# Patient Record
Sex: Female | Born: 1935 | Race: White | Hispanic: No | Smoking: Current some day smoker
Health system: Southern US, Community
[De-identification: ages and names within clinical notes are randomized; demographics above are authoritative.]

## PROBLEM LIST (undated history)

## (undated) DIAGNOSIS — I10 Essential (primary) hypertension: Secondary | ICD-10-CM

## (undated) DIAGNOSIS — E785 Hyperlipidemia, unspecified: Secondary | ICD-10-CM

## (undated) DIAGNOSIS — Z87448 Personal history of other diseases of urinary system: Secondary | ICD-10-CM

## (undated) DIAGNOSIS — Z8669 Personal history of other diseases of the nervous system and sense organs: Secondary | ICD-10-CM

## (undated) DIAGNOSIS — Z8659 Personal history of other mental and behavioral disorders: Secondary | ICD-10-CM

## (undated) DIAGNOSIS — I4891 Unspecified atrial fibrillation: Secondary | ICD-10-CM

## (undated) DIAGNOSIS — C50919 Malignant neoplasm of unspecified site of unspecified female breast: Secondary | ICD-10-CM

## (undated) DIAGNOSIS — K5790 Diverticulosis of intestine, part unspecified, without perforation or abscess without bleeding: Secondary | ICD-10-CM

## (undated) DIAGNOSIS — I341 Nonrheumatic mitral (valve) prolapse: Secondary | ICD-10-CM

## (undated) DIAGNOSIS — K219 Gastro-esophageal reflux disease without esophagitis: Secondary | ICD-10-CM

## (undated) DIAGNOSIS — H269 Unspecified cataract: Secondary | ICD-10-CM

## (undated) DIAGNOSIS — E039 Hypothyroidism, unspecified: Secondary | ICD-10-CM

## (undated) HISTORY — PX: SHOULDER SURGERY: SHX246

## (undated) HISTORY — DX: Diverticulosis of intestine, part unspecified, without perforation or abscess without bleeding: K57.90

## (undated) HISTORY — DX: Malignant neoplasm of unspecified site of unspecified female breast: C50.919

## (undated) HISTORY — PX: OTHER SURGICAL HISTORY: SHX169

## (undated) HISTORY — DX: Personal history of other diseases of urinary system: Z87.448

## (undated) HISTORY — DX: Hyperlipidemia, unspecified: E78.5

## (undated) HISTORY — DX: Hypothyroidism, unspecified: E03.9

## (undated) HISTORY — DX: Unspecified atrial fibrillation: I48.91

## (undated) HISTORY — DX: Essential (primary) hypertension: I10

## (undated) HISTORY — DX: Personal history of other mental and behavioral disorders: Z86.59

## (undated) HISTORY — DX: Personal history of other diseases of the nervous system and sense organs: Z86.69

## (undated) HISTORY — DX: Unspecified cataract: H26.9

## (undated) HISTORY — DX: Gastro-esophageal reflux disease without esophagitis: K21.9

## (undated) HISTORY — DX: Nonrheumatic mitral (valve) prolapse: I34.1

---

## 1995-09-07 HISTORY — PX: LEFT HEART CATH: SHX5946

## 1995-09-07 HISTORY — PX: INGUINAL HERNIA REPAIR: SUR1180

## 1997-12-05 ENCOUNTER — Other Ambulatory Visit: Admission: RE | Admit: 1997-12-05 | Discharge: 1997-12-05 | Payer: Self-pay | Admitting: Obstetrics and Gynecology

## 1998-12-08 ENCOUNTER — Other Ambulatory Visit: Admission: RE | Admit: 1998-12-08 | Discharge: 1998-12-08 | Payer: Self-pay | Admitting: Obstetrics and Gynecology

## 1999-12-14 ENCOUNTER — Other Ambulatory Visit: Admission: RE | Admit: 1999-12-14 | Discharge: 1999-12-14 | Payer: Self-pay | Admitting: Obstetrics and Gynecology

## 2000-11-15 ENCOUNTER — Other Ambulatory Visit: Admission: RE | Admit: 2000-11-15 | Discharge: 2000-11-15 | Payer: Self-pay | Admitting: Obstetrics and Gynecology

## 2007-11-22 ENCOUNTER — Encounter: Admission: RE | Admit: 2007-11-22 | Discharge: 2007-11-22 | Payer: Self-pay | Admitting: Internal Medicine

## 2007-11-30 ENCOUNTER — Encounter: Admission: RE | Admit: 2007-11-30 | Discharge: 2007-11-30 | Payer: Self-pay | Admitting: General Surgery

## 2007-12-11 ENCOUNTER — Encounter: Admission: RE | Admit: 2007-12-11 | Discharge: 2007-12-11 | Payer: Self-pay | Admitting: *Deleted

## 2007-12-14 ENCOUNTER — Ambulatory Visit (HOSPITAL_COMMUNITY): Admission: RE | Admit: 2007-12-14 | Discharge: 2007-12-14 | Payer: Self-pay | Admitting: *Deleted

## 2008-01-09 ENCOUNTER — Ambulatory Visit: Admission: RE | Admit: 2008-01-09 | Discharge: 2008-01-09 | Payer: Self-pay | Admitting: Gynecologic Oncology

## 2008-01-10 ENCOUNTER — Ambulatory Visit (HOSPITAL_COMMUNITY): Admission: RE | Admit: 2008-01-10 | Discharge: 2008-01-10 | Payer: Self-pay | Admitting: Gynecologic Oncology

## 2008-01-11 ENCOUNTER — Ambulatory Visit (HOSPITAL_COMMUNITY): Admission: RE | Admit: 2008-01-11 | Discharge: 2008-01-11 | Payer: Self-pay | Admitting: Gynecologic Oncology

## 2008-01-12 ENCOUNTER — Ambulatory Visit (HOSPITAL_COMMUNITY): Admission: RE | Admit: 2008-01-12 | Discharge: 2008-01-12 | Payer: Self-pay | Admitting: Gynecologic Oncology

## 2008-02-27 ENCOUNTER — Ambulatory Visit (HOSPITAL_COMMUNITY): Admission: RE | Admit: 2008-02-27 | Discharge: 2008-02-28 | Payer: Self-pay | Admitting: General Surgery

## 2008-07-15 IMAGING — US US TRANSVAGINAL NON-OB
1 series · 13 of 25 positions shown · non-contrast
Comparison: CT pelvis 11/30/2007.

CLINICAL DATA: Cystic and solid "mass" in the right adnexa on
previous CT examination.

TRANSABDOMINAL AND TRANSVAGINAL ULTRASOUND OF PELVIS 01/10/2008:
TECHNIQUE: Both transabdominal and transvaginal ultrasound
examinations of the pelvis were performed including evaluation of
the uterus, ovaries, adnexal regions, and pelvic cul-de-sac.
Because of incomplete bladder filling on the initial images, repeat
transabdominal imaging was performed approximately 2 hours after
the initial images to be sure that the right adnexa was completely
imaged.

[Series 1: unknown · 0.22mm/px · 13 of 100 slices shown]
[im 1/100]
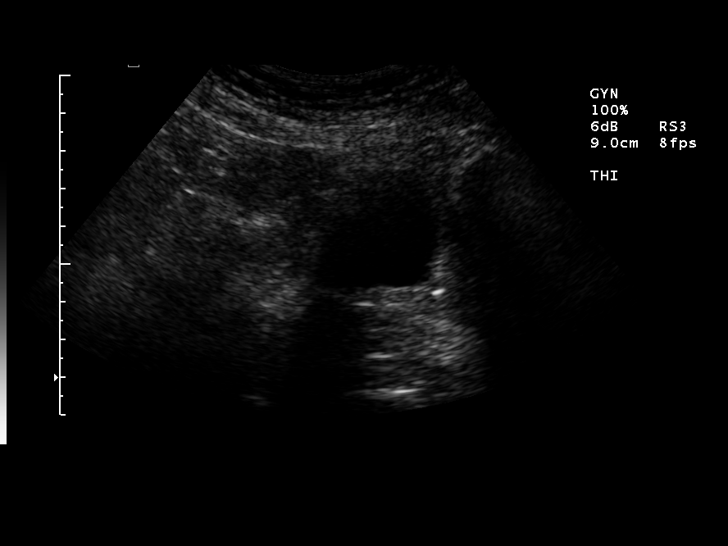
[im 9/100]
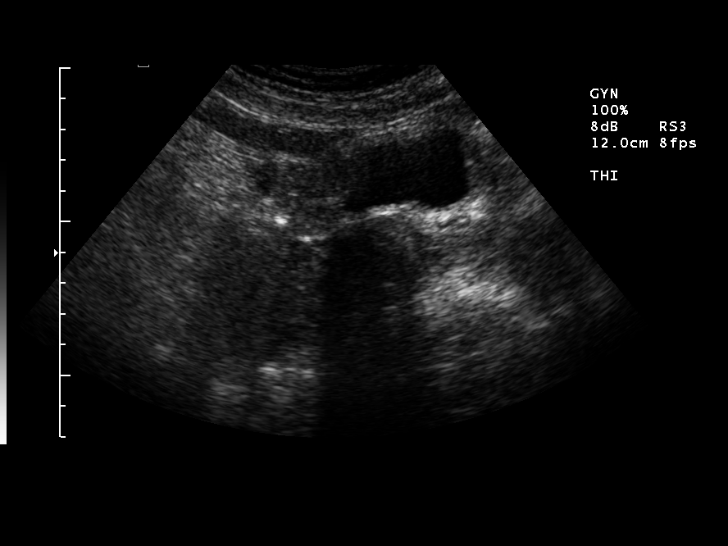
[im 17/100]
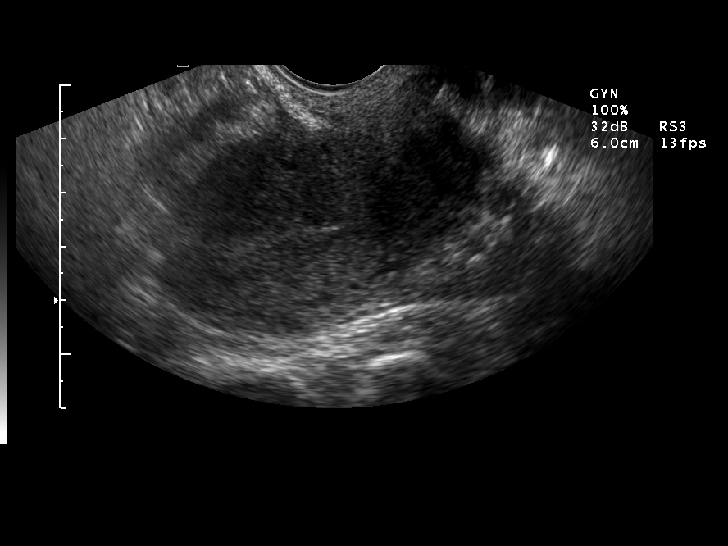
[im 25/100]
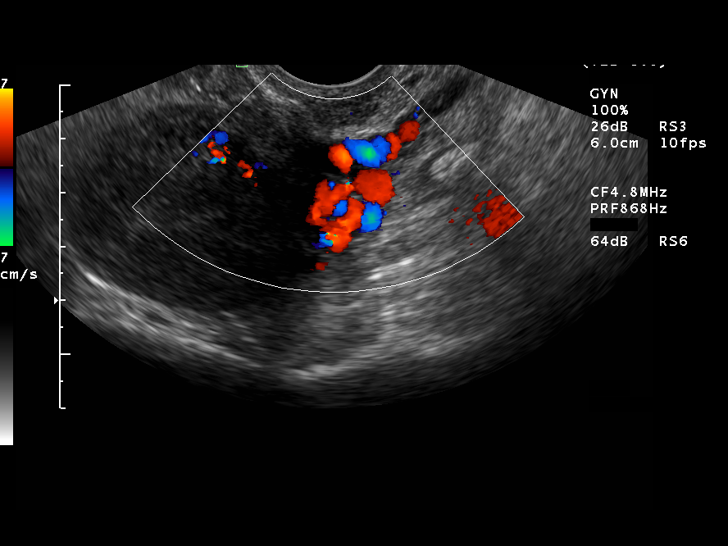
[im 34/100]
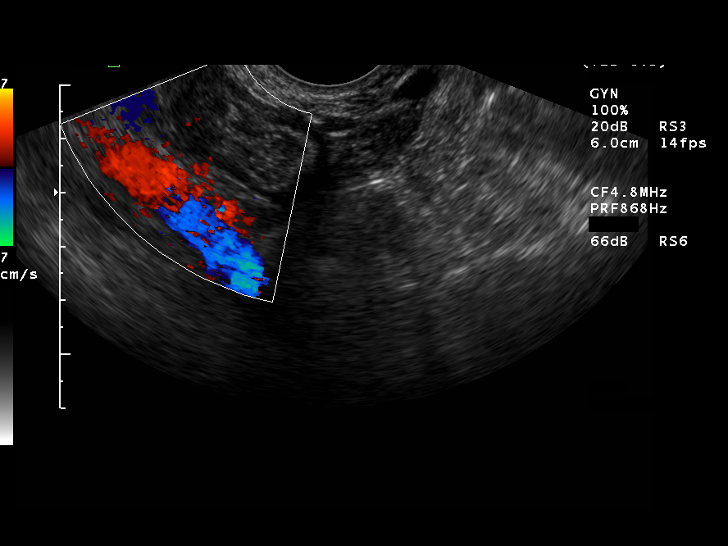
[im 42/100]
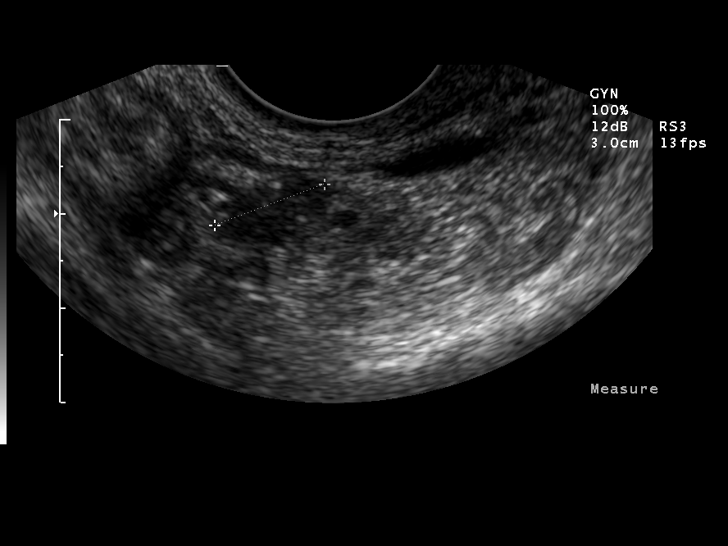
[im 50/100]
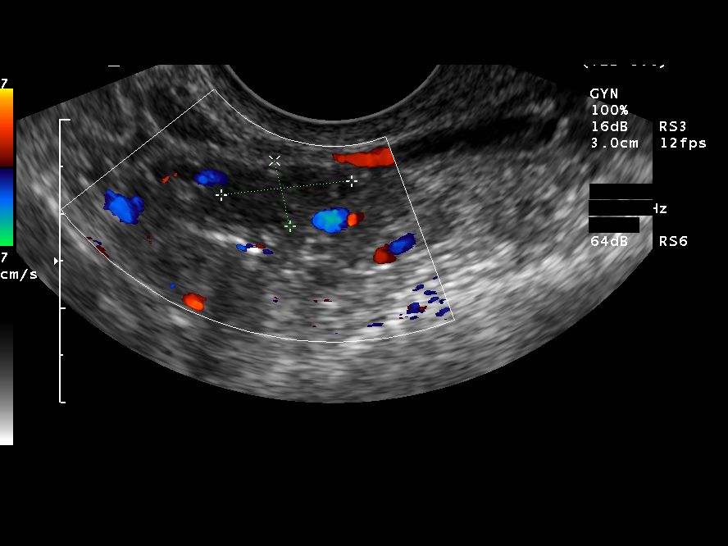
[im 58/100]
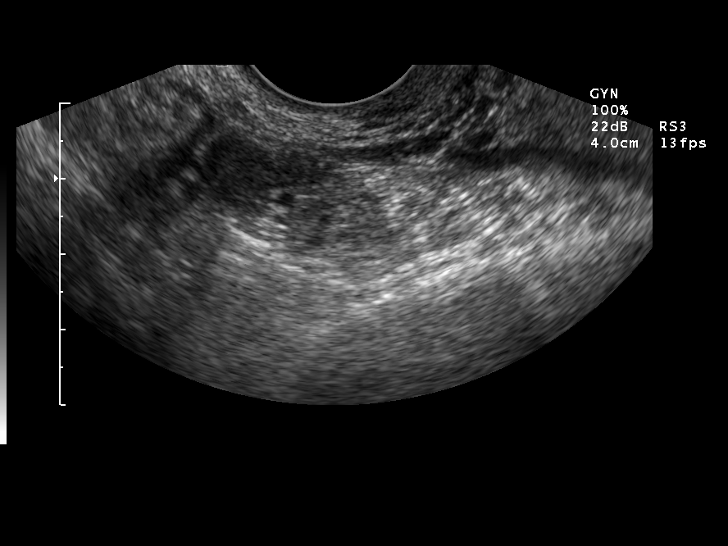
[im 67/100]
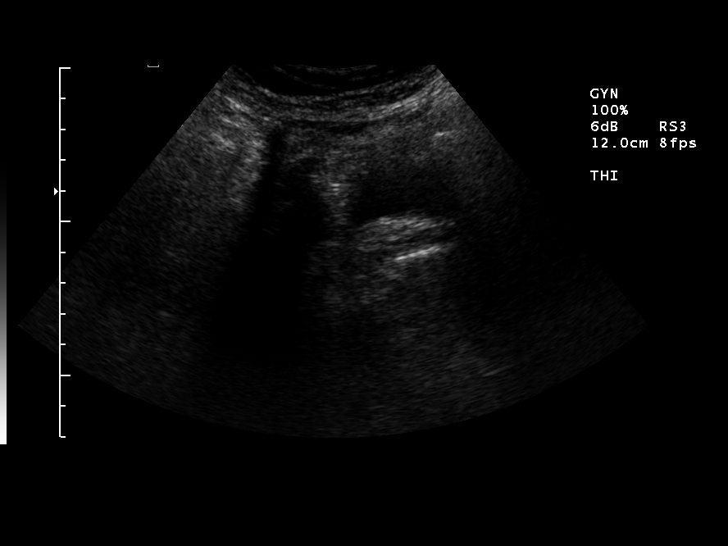
[im 75/100]
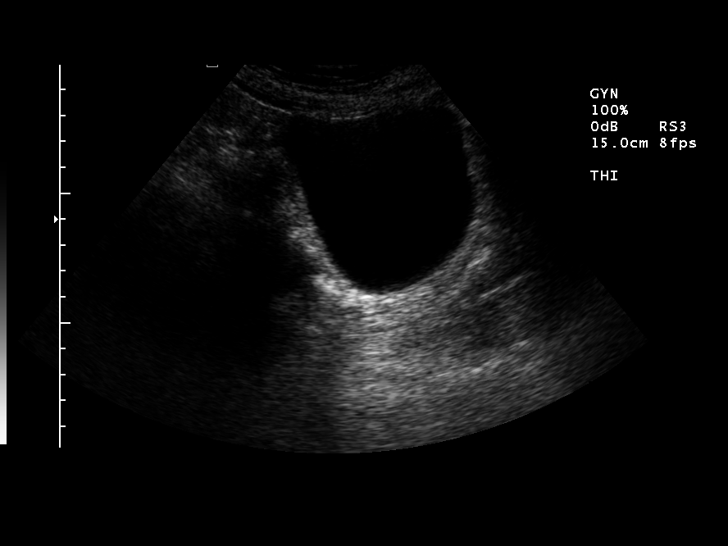
[im 83/100]
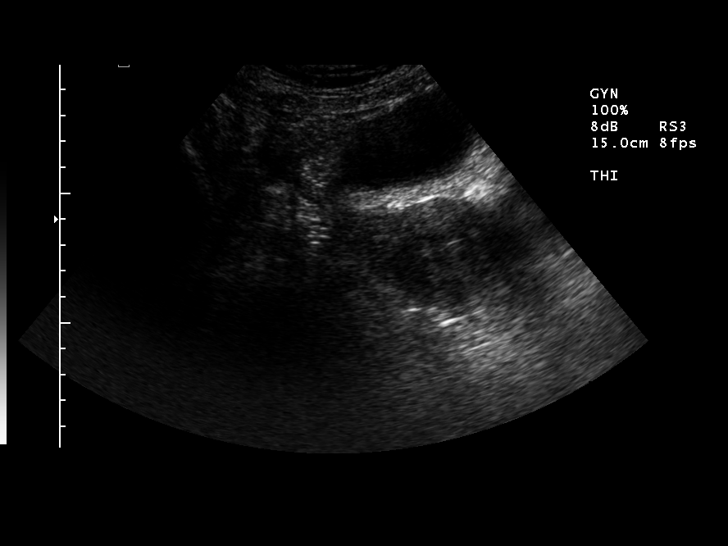
[im 91/100]
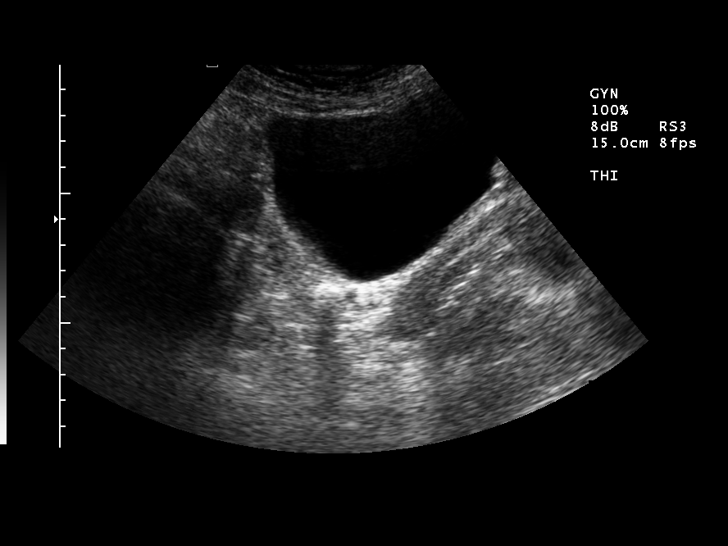
[im 100/100]
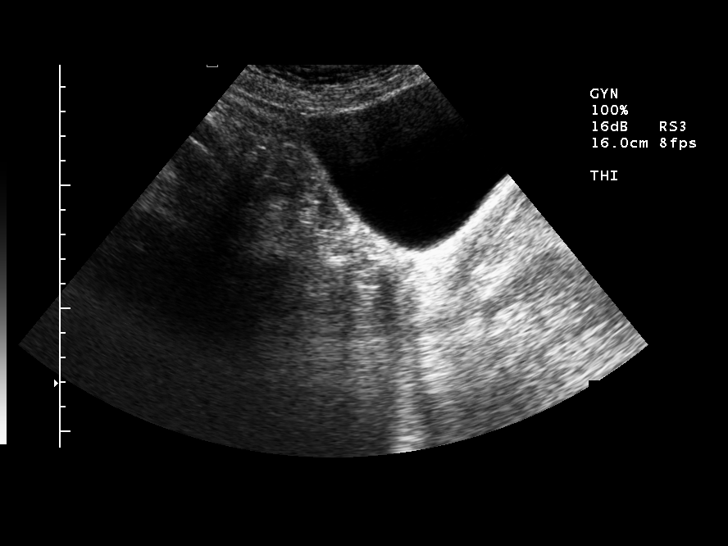

[13 of 25 positions shown; findings below may reference images not displayed]

FINDINGS: The mass in the right side of the pelvis identified on
the prior CT examination was not identified on the ultrasound,
either using transabdominal or transvaginal technique, even after
allowing the bladder to completely fill.  A structure in the right
adnexa which has the appearance of an ovary was identified and
measures approximately 1.4 x 1.4 x 0.7 cm.

Uterus normal in size and appearance for age without focal
myometrial abnormalities, measuring approximately 6.8 x 3.8 x
cm.  Endometrium normal in appearance measuring approximately 4 mm
in thickness.  No endometrial fluid or mass.
IMPRESSION: 1.  The mass in the right side of the pelvis identified on the
previous CT is not identified at ultrasound.  What appeared to be
an atrophic right ovary was identified in the right adnexa.
2.  Nonvisualization of the left ovary.
3.  Normal appearing uterus.

Given the fact that the mass was not visualized by ultrasound nor
at colonoscopy, CT of the pelvis would be suggested in follow-up.

## 2008-07-17 IMAGING — CT CT PELVIS W/ CM
2 of 5 series · 17 of 46 positions shown, 19 images · IV contrast (agent unspecified)
Comparison: 11/30/2007

CLINICAL DATA: Right lower quadrant mass

CT PELVIS WITH CONTRAST,CT ABDOMEN WITH CONTRAST
TECHNIQUE: Multidetector CT imaging of the pelvis was performed
following the standard protocol during administration of
intravenous contrast.,Technique:  Multidetector CT imaging of the
abdomen was performed following the standard protocol during bolus
Contrast: 100 ml of 2mnipaque-N22

[Series 2: abd_pel 5.0 b40s · axial · 0.52mm/px · z∈[-570,-180]mm · 14 of 88 slices shown, 16 images]
[im 5/88  soft-tissue]
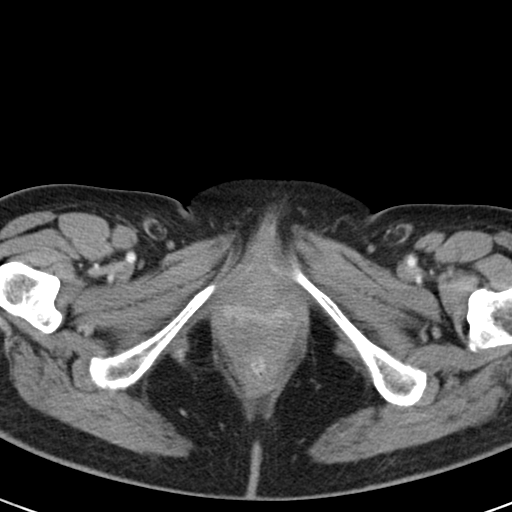
[im 5/88  bone]
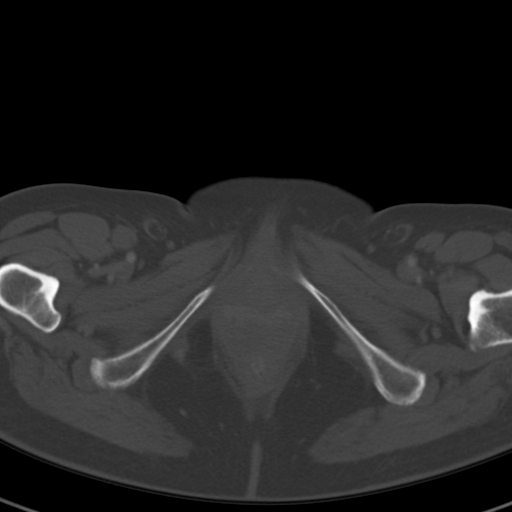
[im 10/88  soft-tissue]
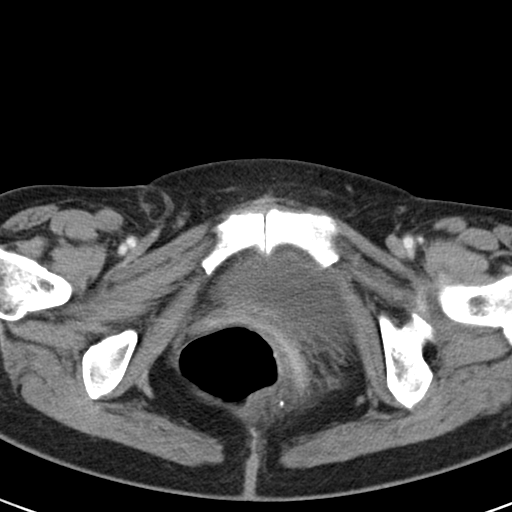
[im 19/88  soft-tissue]
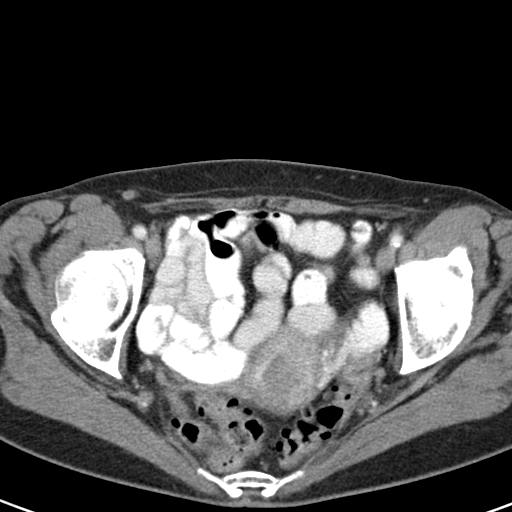
[im 23/88  soft-tissue]
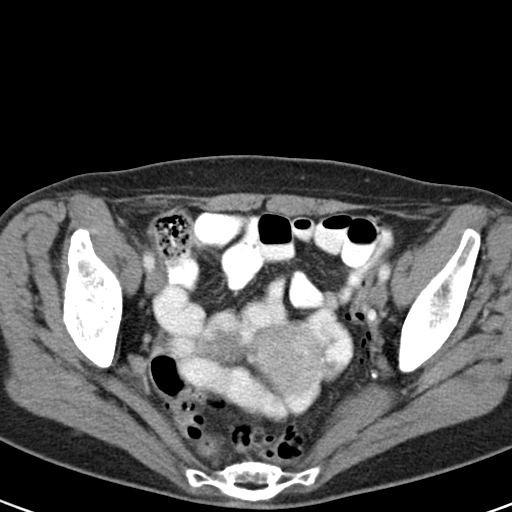
[im 28/88  soft-tissue]
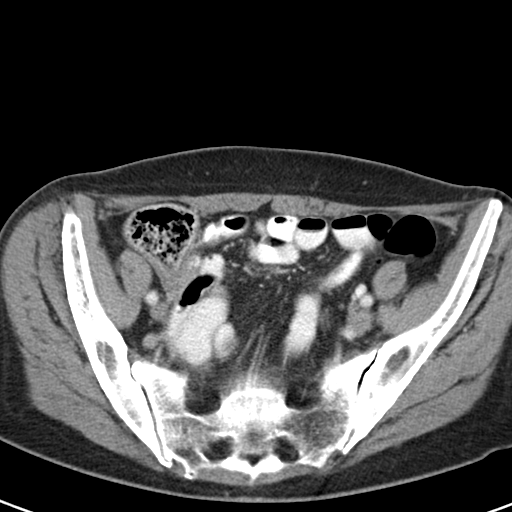
[im 37/88  soft-tissue]
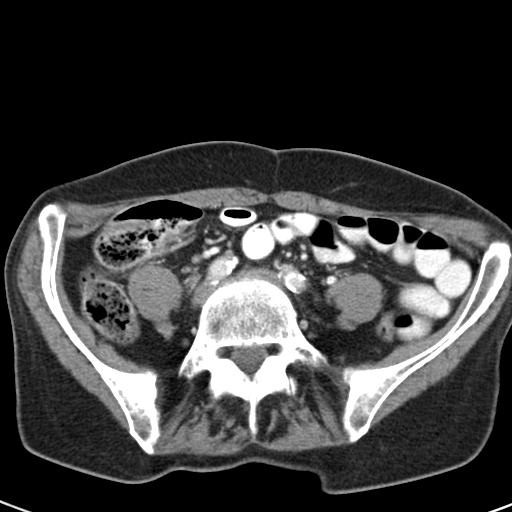
[im 42/88  soft-tissue]
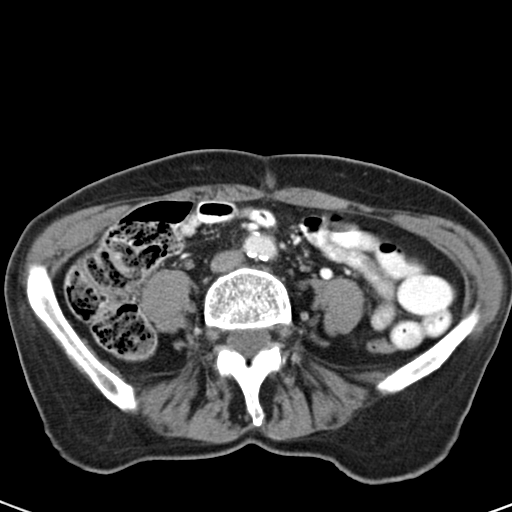
[im 46/88  soft-tissue]
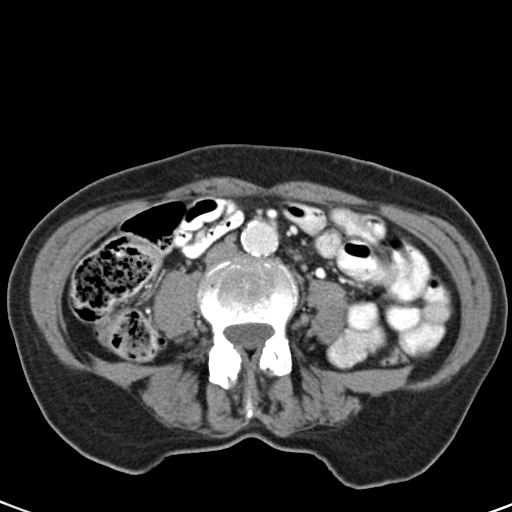
[im 51/88  soft-tissue]
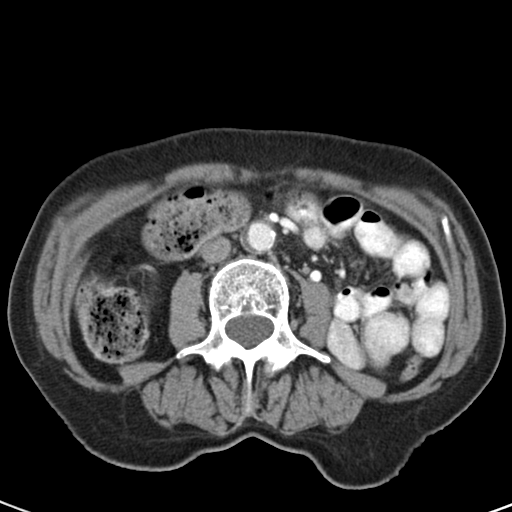
[im 51/88  bone]
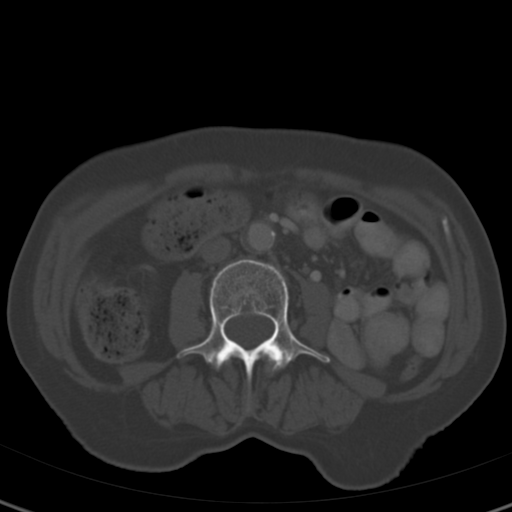
[im 60/88  soft-tissue]
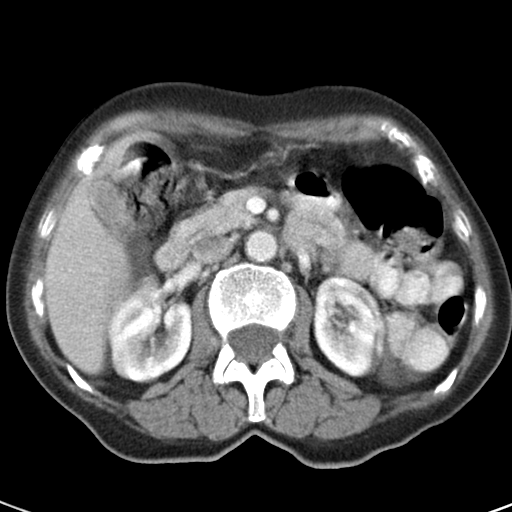
[im 65/88  soft-tissue]
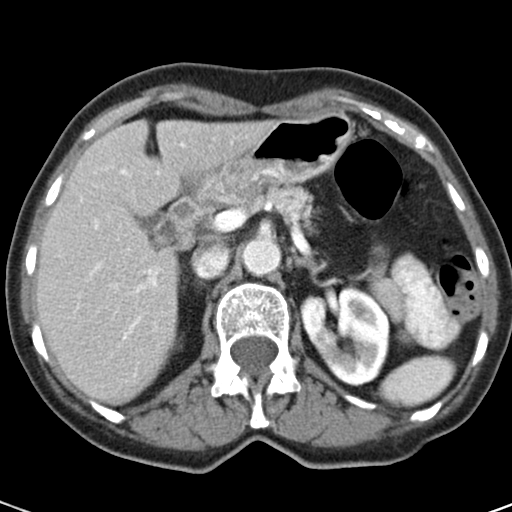
[im 69/88  soft-tissue]
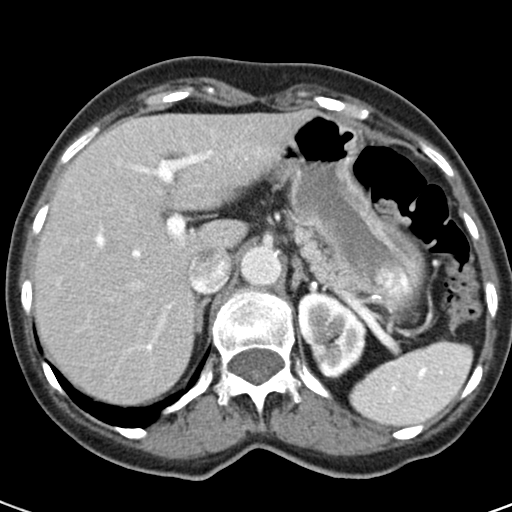
[im 78/88  soft-tissue]
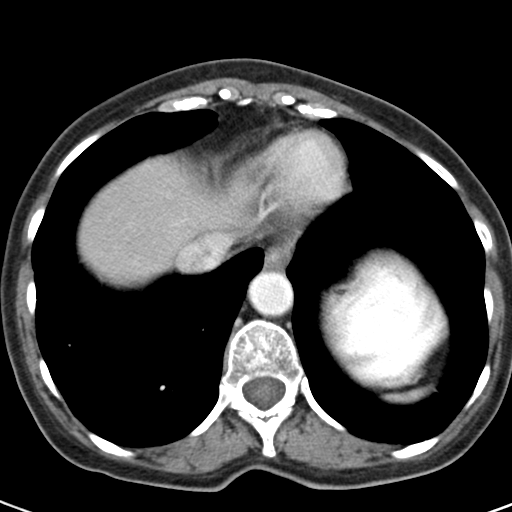
[im 83/88  soft-tissue]
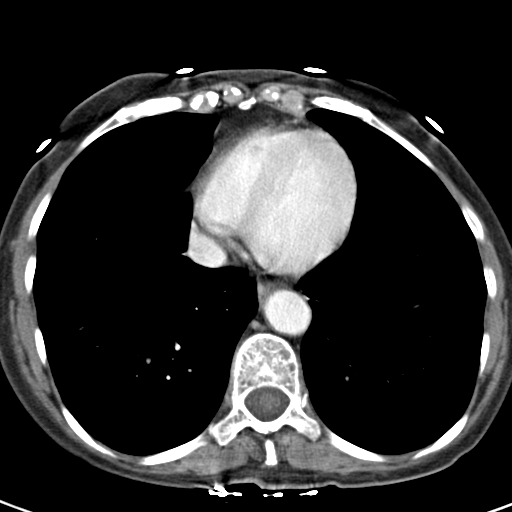

[Series 602: <mpr thick range> · coronal · 0.86mm/px · 3 of 70 slices shown]
[im 24/70  soft-tissue]
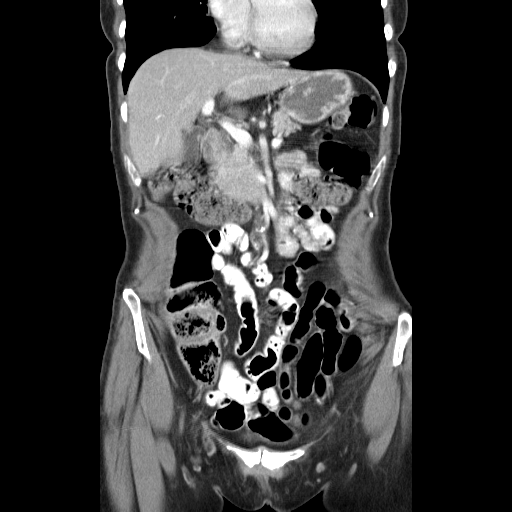
[im 31/70  soft-tissue]
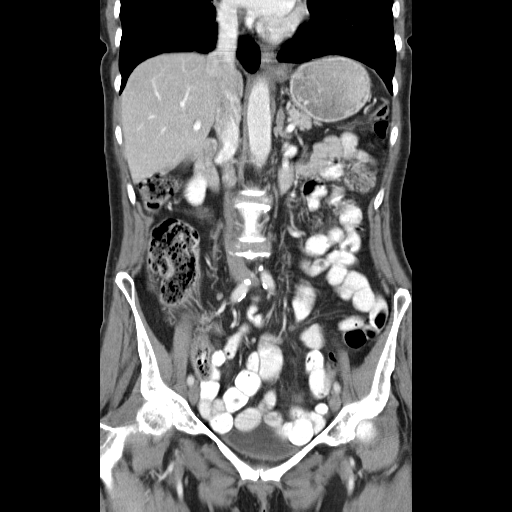
[im 39/70  soft-tissue]
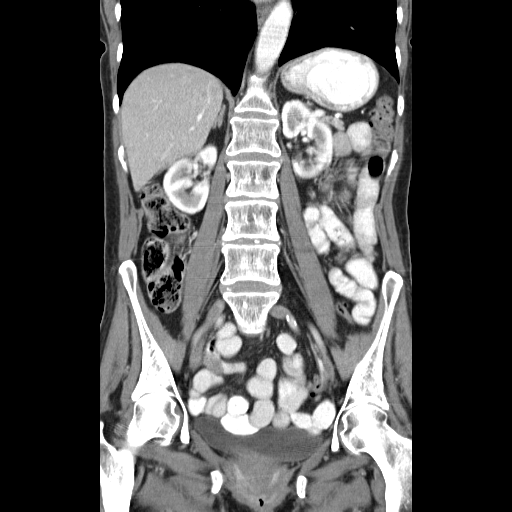

[17 of 46 positions shown; findings below may reference images not displayed]

FINDINGS: The visualized lung bases are unremarkable.  No
destructive bony lesions are noted.  Liver, spleen, pancreas,
adrenals and kidneys are unremarkable.  There is no bowel
obstruction.  There is no ascites or free air.  No adenopathy
noted.  The gallbladder is contracted.  Atherosclerotic
calcifications of the abdominal aorta and the iliac arteries are
again noted. Moderate amount of stool noted within colon.
IMPRESSION: 1.  No acute inflammatory process within abdomen.
2.  No bowel obstruction no ascites or free air.

Findings for the pelvis:

The previously enhancing pelvic mass in the right lower quadrant is
not identified .  Only small amount of residual soft tissue density
noted in the same location measures 1.5 by 1.6 cm.  This is most
likely due to scarring.  No new of pelvic mass is noted.  The
uterus is somewhat heterogeneous without evidence of fibroids.  No
adnexal mass is noted.  Air noted within rectum.  The urinary
bladder is unremarkable.  No pelvic ascites or adenopathy.
Multiple sigmoid colon diverticula are noted without evidence of
acute diverticulitis.
IMPRESSION: 1.  The previous enhancing right lower quadrant mass is not
identified.  Only small amount of residual soft tissue density
noted in the same location measures 1.5 x 1.6 cm.  There is no
significant enhancement.  This is most likely due to scarring.  I
would recommend a follow-up CT scan in 3 months to assure
stability.
2.  No pelvic ascites or adenopathy.

## 2010-06-22 ENCOUNTER — Encounter: Admission: RE | Admit: 2010-06-22 | Discharge: 2010-06-22 | Payer: Self-pay | Admitting: Obstetrics and Gynecology

## 2010-06-29 ENCOUNTER — Encounter: Admission: RE | Admit: 2010-06-29 | Discharge: 2010-06-29 | Payer: Self-pay | Admitting: Obstetrics and Gynecology

## 2010-07-06 ENCOUNTER — Encounter: Admission: RE | Admit: 2010-07-06 | Discharge: 2010-07-06 | Payer: Self-pay | Admitting: Obstetrics and Gynecology

## 2010-07-07 HISTORY — PX: TOTAL MASTECTOMY: SHX6129

## 2010-07-09 ENCOUNTER — Ambulatory Visit: Payer: Self-pay | Admitting: Oncology

## 2010-07-09 ENCOUNTER — Encounter: Payer: Self-pay | Admitting: Cardiology

## 2010-07-13 LAB — CBC WITH DIFFERENTIAL/PLATELET
BASO%: 0.7 % (ref 0.0–2.0)
Eosinophils Absolute: 0.1 10*3/uL (ref 0.0–0.5)
LYMPH%: 50.1 % — ABNORMAL HIGH (ref 14.0–49.7)
MCHC: 34.3 g/dL (ref 31.5–36.0)
MCV: 96.2 fL (ref 79.5–101.0)
MONO#: 0.5 10*3/uL (ref 0.1–0.9)
MONO%: 7.6 % (ref 0.0–14.0)
NEUT#: 2.5 10*3/uL (ref 1.5–6.5)
Platelets: 280 10*3/uL (ref 145–400)
RBC: 3.95 10*6/uL (ref 3.70–5.45)
RDW: 13.6 % (ref 11.2–14.5)
WBC: 6.4 10*3/uL (ref 3.9–10.3)

## 2010-07-13 LAB — COMPREHENSIVE METABOLIC PANEL
ALT: <8 U/L (ref 0–35)
Albumin: 4.1 g/dL (ref 3.5–5.2)
Alkaline Phosphatase: 52 U/L (ref 39–117)
Potassium: 3.9 mEq/L (ref 3.5–5.3)
Sodium: 139 mEq/L (ref 135–145)
Total Bilirubin: 0.3 mg/dL (ref 0.3–1.2)
Total Protein: 7 g/dL (ref 6.0–8.3)

## 2010-07-15 ENCOUNTER — Encounter: Payer: Self-pay | Admitting: Cardiology

## 2010-07-17 ENCOUNTER — Ambulatory Visit (HOSPITAL_COMMUNITY): Admission: RE | Admit: 2010-07-17 | Discharge: 2010-07-17 | Payer: Self-pay | Admitting: Oncology

## 2010-07-17 ENCOUNTER — Encounter: Payer: Self-pay | Admitting: Cardiovascular Disease

## 2010-07-21 ENCOUNTER — Ambulatory Visit (HOSPITAL_COMMUNITY): Admission: RE | Admit: 2010-07-21 | Discharge: 2010-07-21 | Payer: Self-pay | Admitting: Oncology

## 2010-07-21 ENCOUNTER — Ambulatory Visit: Payer: Self-pay

## 2010-07-21 ENCOUNTER — Ambulatory Visit: Payer: Self-pay | Admitting: Cardiovascular Disease

## 2010-07-23 ENCOUNTER — Encounter: Payer: Self-pay | Admitting: Cardiology

## 2010-07-23 ENCOUNTER — Ambulatory Visit: Payer: Self-pay | Admitting: Cardiology

## 2010-07-23 DIAGNOSIS — I4891 Unspecified atrial fibrillation: Secondary | ICD-10-CM

## 2010-07-24 ENCOUNTER — Ambulatory Visit (HOSPITAL_COMMUNITY): Admission: RE | Admit: 2010-07-24 | Discharge: 2010-07-26 | Payer: Self-pay | Admitting: General Surgery

## 2010-07-24 ENCOUNTER — Encounter (INDEPENDENT_AMBULATORY_CARE_PROVIDER_SITE_OTHER): Payer: Self-pay | Admitting: General Surgery

## 2010-07-27 ENCOUNTER — Telehealth: Payer: Self-pay | Admitting: Cardiology

## 2010-08-26 ENCOUNTER — Ambulatory Visit: Payer: Self-pay | Admitting: Oncology

## 2010-09-27 ENCOUNTER — Encounter: Payer: Self-pay | Admitting: Internal Medicine

## 2010-09-28 ENCOUNTER — Ambulatory Visit
Admission: RE | Admit: 2010-09-28 | Discharge: 2010-10-06 | Payer: Self-pay | Source: Home / Self Care | Attending: Radiation Oncology | Admitting: Radiation Oncology

## 2010-10-01 ENCOUNTER — Ambulatory Visit: Payer: Self-pay | Admitting: Oncology

## 2010-10-07 HISTORY — PX: BREAST RECONSTRUCTION: SHX9

## 2010-10-08 NOTE — Letter (Signed)
Summary: [facility] Office Visit  [facility] Office Visit   Imported By: Marilynne Drivers 07/29/2010 14:17:08  _____________________________________________________________________  External Attachment:    Type:   Image     Comment:   External Document

## 2010-10-08 NOTE — Assessment & Plan Note (Signed)
Summary: np6/surgical clearance/jml

## 2010-10-08 NOTE — Progress Notes (Signed)
Summary: afterhours message  Phone Note Other Incoming   Caller: afterhours/scott Summary of Call: pt  dizziness from cardizem pt was told to go back on diltiazem and dr. Aundra Dubin nurse will follow-up. Initial call taken by: Regan Lemming,  July 27, 2010 11:00 AM  Follow-up for Phone Call        I talked with pt's daughter, Danton Clap --she states pt started Diltiazem CD 120mg  07/23/10--she had dizziness the morning of 07/24/10-before she went ot the hospital for her surgery--she was hospitalized until 07/26/10-per daughter she was given Diltiazem CD 120mg  in the hospital and she c/o dizziness--per daughter pt was not given Diltiazem CD 120mg  07/26/10 before she left the hospital--she took Diltiazem 30mg  last night when she got home and felt fine--reviewed with Dr Vassie Moment recommended pt D/C Ditiazem CD 120 and restart Diltiazem 30mg  two times a day(her  previous dose) --Marshall, RN, BSN  July 27, 2010 11:50 AM --I talked with daughter,Alice--she verbalized understanding    New/Updated Medications: DILTIAZEM HCL 30 MG TABS (DILTIAZEM HCL) one twice a day   Current Medications (verified): 1)  Levothroid 88 Mcg Tabs (Levothyroxine Sodium) .... Once Daily 2)  Diltiazem Hcl 30 Mg Tabs (Diltiazem Hcl) .... One Twice A Day 3)  Losartan Potassium 50 Mg Tabs (Losartan Potassium) .... Once Daily  Allergies: No Known Drug Allergies

## 2010-10-08 NOTE — Letter (Signed)
Summary: [facility] Office Visit  [facility] Office Visit   Imported By: Marilynne Drivers 07/29/2010 14:18:58  _____________________________________________________________________  External Attachment:    Type:   Image     Comment:   External Document

## 2010-10-08 NOTE — Assessment & Plan Note (Signed)
Summary: [facility]   Visit Type:  Follow-up Primary Provider:  Dr. Shelia Media   History of Present Illness: 76 yo with history of HTN and recently diagnosed breast cancer presents for evaluation of atrial fibrillation prior to mastectomy and reconstruction tomorrow.  Patient states that in 1997, at the time of her hernia surgery, she was told that she had "irregular heart rate."  She had a left heart catheterization with Dr. Glade Lloyd at that time that she says was normal.  She has never been told prior to yesterday that she has atrial fibrillation.  Yesterday, it appears that she was undergoing pre-op evaluation prior to breast surgery and was noted on ECG to have atrial fibrillation.  She is sent here today for evaluation of this.  Paient states that she has been doing quite well functionally.  She has been living with her daughter in [place] since her husband passed away.  She has good exercise tolerance and is able to do all housework and yardwork as well as walk briskly with no problems.  She has noted no recent decrease in exercise tolerance.  No chest pain.  No syncope or tachypalpitations.  Echo showed preserved EF.   ECG: Atrial fib at 56, otherwise normal  Labs (11/11): K 4.5, creatinine 0.86, HCT 38  Current Medications (verified): 1)  Levothroid 88 Mcg Tabs (Levothyroxine Sodium) .... Once Daily 2)  Diltiazem Hcl 30 Mg Tabs (Diltiazem Hcl) .... Two Times A Day 3)  Losartan Potassium 50 Mg Tabs (Losartan Potassium) .... Once Daily  Allergies (verified): No Known Drug Allergies  Past History:  Past Medical History: 1. Left heart cath in 1997 normal per patient's report 2. Hypothyroidism 3. HTN 4. Atrial fibrillation: of unclear duration.  Echo (11/11): EF 0000000, grade I diastolic dysfunction, mild LAE 5. Breast cancer: Invasive ductal, right breast.  Plan for mastectomy and chemotherapy.  6. Prior smoker: Quit 11/11 7. s/p inguinal hernia repair 1997  Family  History: Father with atrial fibrillation, no premature CAD.   Social History: Widow.  Retired from custodial work.  Lives in [place] now with her daughter.  Quit smoking in 11/11.  4  children.   Review of Systems       All systems reviewed and negative except as per HPI.   Vital Signs:  Patient profile:   76 year old female Weight:      107 pounds Pulse rate:   69 / minute BP sitting:   112 / 70  (left arm)  Vitals Entered By: Margaretmary Bayley CMA (July 23, 2010 9:44 AM)  Physical Exam  General:  Well developed, well nourished, in no acute distress. Head:  normocephalic and atraumatic Nose:  no deformity, discharge, inflammation, or lesions Mouth:  Teeth, gums and palate normal. Oral mucosa normal. Neck:  Neck supple, no JVD. No masses, thyromegaly or abnormal cervical nodes. Lungs:  Clear bilaterally to auscultation and percussion. Heart:  Non-displaced PMI, chest non-tender; irregular rate and rhythm, S1, S2 without murmurs, rubs or gallops. Carotid upstroke normal, no bruit. Pedals normal pulses. No edema, no varicosities. Abdomen:  Bowel sounds positive; abdomen soft and non-tender without masses, organomegaly, or hernias noted. No hepatosplenomegaly. Msk:  Back normal, normal gait. Muscle strength and tone normal. Extremities:  No clubbing or cyanosis. Neurologic:  Alert and oriented x 3. Psych:  Normal affect.   Impression & Recommendations:  Problem # 1:  ATRIAL FIBRILLATION (ICD-427.31) Patient found to be in atrial fibrillation during pre-operative assessment.  It is unclear the  duration of the atrial fib as the patient is minimally symptomatic.  She has been told she has "irregular heart rate" in the past but has never been told prior to this that she has atrial fibrillation. She is on short acting diltiazem but heart rate is well-controlled.  She denies any cardiopulmonary symptoms.  Her CHADSVASC score of 3 (age, gender, HTN) so ideally would be anticoagulated.   I am going to have her start long-acting diltiazem CD 120 mg daily to replace the short-acting diltiazem and will take a dose tonight.  I think that she is stable to undergo surgery tomorrow with no further evaluation (rate control should be sufficient for her atrial fibrillation as she is asymptomatic).  She will start on anticoagulation when I see her back after her surgery.  We discussed coumadin and Pradaxa, she strongly prefers Pradaxa.  Will start her on this when she follows up.   Patient Instructions: 1)  Your physician has recommended you make the following change in your medication:  2)  Stop Diltiazem 30mg . 3)  Start Diltiazem CD 120mg  daily. 4)  Appointment  with Dr Aundra Dubin Monday December 5,2011 at 11am. Prescriptions: DILTIAZEM HCL CR 120 MG XR24H-CAP (DILTIAZEM HCL) one daily  #30 x 6   Entered by:   Desiree Lucy, RN, BSN   Authorized by:   Loralie Champagne, MD   Signed by:   Desiree Lucy, RN, BSN on 07/23/2010   Method used:   Electronically to        [facility] [phone]* (retail)       [address]       [place], [place]  [address]       Ph: [phone]       Fax: [phone]   [place]:   [phone]

## 2010-10-26 ENCOUNTER — Encounter (HOSPITAL_BASED_OUTPATIENT_CLINIC_OR_DEPARTMENT_OTHER)
Admission: RE | Admit: 2010-10-26 | Discharge: 2010-10-26 | Disposition: A | Discharge status: Home / Self Care | Payer: Medicare Other | Source: Ambulatory Visit | Attending: Plastic Surgery | Admitting: Plastic Surgery

## 2010-10-26 DIAGNOSIS — Z01812 Encounter for preprocedural laboratory examination: Secondary | ICD-10-CM | POA: Insufficient documentation

## 2010-10-26 LAB — BASIC METABOLIC PANEL
BUN: 10 mg/dL (ref 6–23)
Calcium: 9.6 mg/dL (ref 8.4–10.5)
Chloride: 105 mEq/L (ref 96–112)
Creatinine, Ser: 0.89 mg/dL (ref 0.4–1.2)
GFR calc Af Amer: >60 mL/min (ref >60)

## 2010-10-28 ENCOUNTER — Ambulatory Visit (HOSPITAL_BASED_OUTPATIENT_CLINIC_OR_DEPARTMENT_OTHER)
Admission: RE | Admit: 2010-10-28 | Discharge: 2010-10-28 | Disposition: A | Discharge status: Home / Self Care | Payer: Medicare Other | Source: Ambulatory Visit | Attending: Plastic Surgery | Admitting: Plastic Surgery

## 2010-10-28 DIAGNOSIS — Z01812 Encounter for preprocedural laboratory examination: Secondary | ICD-10-CM | POA: Insufficient documentation

## 2010-10-28 DIAGNOSIS — I10 Essential (primary) hypertension: Secondary | ICD-10-CM | POA: Insufficient documentation

## 2010-10-28 DIAGNOSIS — Z452 Encounter for adjustment and management of vascular access device: Secondary | ICD-10-CM | POA: Insufficient documentation

## 2010-10-28 DIAGNOSIS — Z853 Personal history of malignant neoplasm of breast: Secondary | ICD-10-CM | POA: Insufficient documentation

## 2010-10-28 DIAGNOSIS — J4489 Other specified chronic obstructive pulmonary disease: Secondary | ICD-10-CM | POA: Insufficient documentation

## 2010-10-28 DIAGNOSIS — J449 Chronic obstructive pulmonary disease, unspecified: Secondary | ICD-10-CM | POA: Insufficient documentation

## 2010-10-28 DIAGNOSIS — Z421 Encounter for breast reconstruction following mastectomy: Secondary | ICD-10-CM | POA: Insufficient documentation

## 2010-10-28 DIAGNOSIS — I4891 Unspecified atrial fibrillation: Secondary | ICD-10-CM | POA: Insufficient documentation

## 2010-11-17 LAB — URINALYSIS, ROUTINE W REFLEX MICROSCOPIC
Bilirubin Urine: NEGATIVE
Nitrite: NEGATIVE
Specific Gravity, Urine: 1.005 (ref 1.005–1.030)
Urobilinogen, UA: 0.2 mg/dL (ref 0.0–1.0)

## 2010-11-17 LAB — SURGICAL PCR SCREEN
MRSA, PCR: NEGATIVE
Staphylococcus aureus: NEGATIVE

## 2010-11-17 LAB — COMPREHENSIVE METABOLIC PANEL
AST: 23 U/L (ref 0–37)
CO2: 29 mEq/L (ref 19–32)
Calcium: 9.5 mg/dL (ref 8.4–10.5)
Creatinine, Ser: 0.86 mg/dL (ref 0.4–1.2)
GFR calc Af Amer: >60 mL/min (ref >60)
GFR calc non Af Amer: >60 mL/min (ref >60)
Glucose, Bld: 91 mg/dL (ref 70–99)

## 2010-11-17 LAB — CBC
Hemoglobin: 13.1 g/dL (ref 12.0–15.0)
MCH: 32 pg (ref 26.0–34.0)
MCHC: 33.9 g/dL (ref 30.0–36.0)

## 2010-11-17 LAB — URINE MICROSCOPIC-ADD ON

## 2010-11-17 LAB — DIFFERENTIAL
Lymphocytes Relative: 52 % — ABNORMAL HIGH (ref 12–46)
Lymphs Abs: 3.4 10*3/uL (ref 0.7–4.0)
Neutro Abs: 2.4 10*3/uL (ref 1.7–7.7)
Neutrophils Relative %: 37 % — ABNORMAL LOW (ref 43–77)

## 2010-11-26 NOTE — Op Note (Signed)
Sara Hines, Sara Hines NO.:  [id number]  MEDICAL RECORD NO.:  [id number]          PATIENT TYPE:  LOCATION:                                 FACILITY:  PHYSICIAN:  Crissie Reese, M.D.     DATE OF BIRTH:  [birth year]  DATE OF PROCEDURE:  10/28/2010 DATE OF DISCHARGE:                              OPERATIVE REPORT   PREOPERATIVE DIAGNOSIS:  Right breast cancer.  POSTOPERATIVE DIAGNOSIS:  Right breast cancer.  PROCEDURES PERFORMED: 1. Removal of tissue expander, right side and placement of saline     implant. 2. Removal of Port-A-Cath.  SURGEON:  Crissie Reese, MD  ANESTHESIA:  General.  ESTIMATED BLOOD LOSS:  5 mL.  DRAINS:  One 19-French.  CLINICAL NOTE:  A 76 year old woman has had breast cancer, has had right mastectomy, right reconstruction with tissue expander, has a port in place and she presents today for removal of expander and placement of implant and removal of the port.  Dr. Dalbert Batman has said that it is fine to remove the port and asked that we do that in his place today.  Petra Kuba of the procedure, risks plus complications were discussed including, but not limited to bleeding, infection, anesthesia complications, healing problems, scarring, fluid collections, loss of sensation, damage to underlying tissues, pneumothorax, pulmonary embolism, displacement of device, capsular contracture, failure device, wrinkles, ripples, disappointment, asymmetry.  She understands all these things and wishes to proceed.  She understands that there definitely will be asymmetry with the opposite side.  DESCRIPTION OF PROCEDURE:  The patient was marked in the holding area, was taken to the operating room and placed supine.  After successful general anesthesia, she was prepped with ChloraPrep and after waiting full 3 minutes for drying, she was draped with sterile drapes.  Portion of the old mastectomy scar was utilized approximately 4-5 cm in length. The dissection  was carried down through the subcutaneous tissue and the muscle using electrocautery.  The underlying tissue expander was identified, deflated, and removed.  The capsule was opened superiorly. Only in the inferiorly, the implant appeared to have very good positioning with respect to the opposite side.  Meticulous hemostasis was achieved with electrocautery.  Thorough irrigation with saline and antibiotic solution was placed and allowed to dwell on the space and the implant was also soaked in antibiotic solution for greater than 5 minutes.  After thoroughly cleaning the gloves, the implant was prepared.  This was a Product manager smooth round high profile, maximum fill 225 mL saline implant.  The patient did prefer saline as opposed to silicone gel.  100 mL of sterile saline placed using a closed filling system. The implant was placed back in the antibiotic solution, space inspected, excellent hemostasis was confirmed.  A 19-French drain was positioned through separate stab wound inferolaterally and secured, brought through that stab wound and secured with 3-0 Prolene suture.  The 3-0 Vicryl sutures figure-of-eight were preplaced, but left untied.  Antibiotic solution was placed, implant positioned, fill with maximum 225 mL and antibiotic solution was placed on top of it and then the Vicryl sutures tied securely.  The remainder of the  closure with 3-0 Monocryl interrupted interval deep dermal sutures, running 4-0 Monocryl subcuticular suture.  Attention was then directed to the port.  With the transverse incision, old scar was used, dissection carried down, port identified.  The three Prolene sutures were removed.  The patient placed in a little bit of Trendelenburg and a deep breath given and held by Anesthesia and the port removed.  No evidence any bleeding and the irrigation with antibiotic solution, closed with 3-0 Monocryl interrupted inverted deep sutures with passing the suture through the  posterior aspect of the capsule in order to close the dead space and then 4-0 Monocryl running subcuticular suture.  Dermabond was applied.  The chest vest applied and she was transferred to the recovery room stable having tolerated the procedure well and we will recheck her in the office tomorrow.     Crissie Reese, M.D.     DB/MEDQ  D:  10/28/2010  T:  10/28/2010  Job:  [id number]  Electronically Signed by Crissie Reese M.D. on 11/26/2010 05:08:34 PM

## 2011-01-19 NOTE — Assessment & Plan Note (Signed)
[place]                                 ON-CALL NOTE   SKYYE, GABA                      MRN:          [id number]  DATE:07/25/2010                            DOB:          [birth year]    The person placing call is Danton Clap, the patient's daughter.   HISTORY:  Ms. Duchi is a 76 year old female patient who recently saw Dr.  Aundra Dubin for atrial fibrillation.  Her diltiazem 30 mg b.i.d. was changed  to Cardizem CD 120 mg a day.  The patient's daughter notes that she  developed dizziness shortly after starting to take this.  She came into  the hospital yesterday for breast surgery.  We have not been consulted  on the patient.  Her vital signs in the hospital appeared to be stable.  She has been receiving her Cardizem CD.   PLAN:  Given the patient's symptoms after starting a long-acting  diltiazem, I told the patient's daughter to have the patient just resume  her diltiazem 30 mg twice a day, as she was taking previously.  It  appears that her heart rate was in the 50s with this and her blood  pressure was stable.  I will have Dr. Claris Gladden nurse call the patient on  Monday to get an update of her symptoms and decide whether or not she  should be brought back in for earlier followup to get an EKG and check  her blood pressure and decide on whether or not her medication should be  changed further.   DISPOSITION:  The patient's daughter agreed with this.  Of note, I told  the patient's daughter that if there is any concern that she should ask  the attending service to contact us for a consult while she is an  inpatient.  She agreed with this.      Richardson Dopp, PA-C       Peter C. Johnsie Cancel, MD, [facility]    SW/MedQ  DD: 07/25/2010  DT: 07/25/2010  Job #: [id number]

## 2011-01-19 NOTE — Consult Note (Signed)
[name], Hines NO.:  [id number]   MEDICAL RECORD NO.:  [id number]          PATIENT TYPE:  OUT   LOCATION:  GYN                          FACILITY:  [facility]   PHYSICIAN:  Paola A. Alycia Rossetti, MD    DATE OF BIRTH:  Oct 27, 1934   DATE OF CONSULTATION:  01/09/2008  DATE OF DISCHARGE:                                 CONSULTATION   Patient seen today in consultation at the request of Dr. Philis Pique.   [name] is a 76 year old that in February of 2009 moved to a new home,  began experiencing some bilateral lower quadrant pain. Was seen by her  primary care physician with negative evaluation, negative examination.  They did perform an ultrasound to rule out the possibility of  cholecystitis. The following week her pain increased. Her primary  physician was not available.  She was seen by a 8 assistant,  was provided with pain medications which she could not take and really  did not help her. The following week, she thought that she felt a mass  in her right groin.  She has a history of having a prior hernia repair  and was seen by Dr. Dalbert Batman. At that time, a CT scan of the abdomen and  pelvis was ordered.  The CT was dated November 30, 2007.  It reveals that  the abdominal parenchymal organs are normal in appearance.  The  gallbladder was unremarkable.  There was no lymphadenopathy or  inflammatory process. Within the pelvis, there was a heterogeneously  enhancing soft tissue mass seen in the right pelvis along the right  pelvic sidewall involving the right adnexal structures that measured 5.2  x 5.4 cm. Per the CT report, it favored a colonic or appendiceal  carcinoma, though ovarian cancer cannot be excluded.  There was no  evidence of ascites.  There is mild lymphadenopathy in the right  inguinal region with the largest lymph node measuring 1.9 x 1.6 cm with  no evidence of an inflammatory process, hernia, or dilated bowel loops.  Because of these findings, she  underwent a colonoscopy by Dr. Jim Desanctis  on April 9. It revealed significant diverticulosis, rather severe, with  tortuosity and thickening of the sigmoid colon and internal hemorrhoids.  They were able to pass the colonoscope to the cecum.  They were able to  identify the base of the cecum and the ileocecal valve which were both  photographed. There is no comment regarding the appendix.  With this  negative colonoscopy, it was recommended that she increase her fiber and  take some stool softeners.  She then went to see Dr. Philis Pique at which  time she had an ultrasound performed on April 21.  They measured the  right ovary a 1.7 x 1 x 1.3 and the left ovary at 1.3 x 0.9 x 0.8 cm  with a normal-appearing uterus and  Bartholin cyst.  They felt that  there was a 3 cm right adnexal mass which was separate from the right  ovary with no increase in blood flow by Doppler.  Tumor markers were  drawn  that showed a CEA of 2.4 and a CA-125 of 21.8.  She was  subsequently referred to [place].   REVIEW OF SYSTEMS:  The patient denies any chest pain, shortness of  breath, nausea, vomiting, fevers, chills, headaches, visual changes.  Occasionally she will have some nausea with eating, but that is  exception, not the rule. She does have diverticulosis and is trying to  increase her fiber and take stool softeners.  She denies any  unintentional weight loss or weight gain.  She has had some stress  related to her move but is otherwise feeling well and has an excellent  energy level.   PAST MEDICAL HISTORY:  1. Gastroesophageal reflux disease.  2. Hypothyroidism.   MEDICATIONS:  1. Prempro.  2. Miacalcin with D.  3. Synthroid 80 mcg daily.  4. Fish oil.   ALLERGIES:  None.   PAST SURGICAL HISTORY:  1. Right groin hernia surgery in 1997 by Dr. Margot Chimes.  2. Rotator cuff surgery in the 1990s.  3. Ear surgery on the right side.   SOCIAL HISTORY:  She denies use of alcohol.  She smokes occasionally.    FAMILY HISTORY:  Her father is alive at 74.  He has Barrett's  esophagitis. Her mother has dementia. Paternal grandfather had a jaw  carcinoma.  Maternal aunt had colon cancer.   HEALTH MAINTENANCE:  She is up to date on her Pap smears. Mammogram was  in June 2008, and colonoscopy.   PHYSICAL EXAMINATION:  VITAL SIGNS:  Weight 111 pounds, height 5 feet 6  inches.  GENERAL:  Well-nourished, well-developed  female who appears younger  than stated age in no acute distress.  NECK:  Supple.  There is no lymphadenopathy, no thyromegaly.  LUNGS:  Clear to auscultation bilaterally.  CARDIOVASCULAR:  Regular rate and rhythm.  ABDOMEN:  Soft, nontender, nondistended.  There are no palpable masses  or hepatosplenomegaly.  EXTREMITIES:  Groins:  There is a questionable 1.5 cm groin node on the  right side which is slightly tender.  The left groin is negative.  Extremities have no edema.  PELVIC:  External genitalia is within normal limits though markedly  atrophic.  Bimanual examination, the cervix is palpably normal.  The  corpus of normal size, shape, consistency.  I cannot appreciate any  adnexal masses.  Rectovaginal confirms smooth rectovaginal septum.  There is no nodularity.   ASSESSMENT:  A 76 year old with a 5 cm right hemipelvis mass which on CT  report was felt to be most consistent with appendiceal versus cecal  mass.  Colonoscopy was negative.  There is still a possibility of this  being a cecal mass. On the office ultrasound, it appeared to be separate  from the ovary. Had a lengthy discussion with the patient, her daughter,  and her sister, [name].  At this time they are obviously quite anxious to  figure this out.  We have arranged for her to have an ultrasound here at  the hospital tomorrow morning, May 6, at 9 o'clock so that direct  comparison can be made to her CAT scan.  I will then contact them with  the results. If they do need to have surgery via surgery with   gynecologic oncology, they would like to have it done at [facility] so  that I could perform their surgery. The next dates that I am here and  available for surgery will be June 16, and that would be an unreasonable  amount of time to wait.  She did not appear to be related to the ovary.  We will subsequently refer her back to Dr. Dalbert Batman. Their questions were  elicited and answered to their satisfaction.  They are very pleased with  today's  consultation  and will follow up the results from today.  We are to call  her sister, [name], at [phone] with the ultrasound findings and all  followup appointments, etc.  The patient's daughter, who is her next of  kin, had a stroke in the fall and is having some issues with memory.      Paola A. Alycia Rossetti, MD  Electronically Signed     PAG/MEDQ  D:  01/09/2008  T:  01/09/2008  Job:  [id number]   cc:   Lynnell Chad. Shelia Media, M.D.  Fax: [phone]   Edsel Petrin. Dalbert Batman, M.D.  [id number]    [place] [address]   Waverly Ferrari, M.D.  Fax: [phone]   Bobbye Charleston, M.D.  Fax: [phone]   Caswell Corwin, R.N.  [address], [place] [address]

## 2011-01-19 NOTE — Consult Note (Signed)
Sara Hines, Sara Hines NO.:  [id number]   MEDICAL RECORD NO.:  [id number]          PATIENT TYPE:  OUT   LOCATION:  GYN                          FACILITY:  [facility]   PHYSICIAN:  Paola A. Alycia Rossetti, MD    DATE OF BIRTH:  Dec 16, 1934   DATE OF CONSULTATION:  01/11/2008  DATE OF DISCHARGE:  01/09/2008                                 CONSULTATION   TELEPHONE CONVERSATION   The patient was initially seen by [place] on Jan 09, 2008 for consultation  regarding an adnexal mass noted on CT scan March 26.  It was felt that  the mass was either involving the right adnexa or the colon and small  bowel.  Colonoscopy was negative.  The patient underwent a transvaginal  and transabdominal ultrasound.  Within the right adnexa there is ovary  measuring 1.4 x 1.4 x 0.7 cm.  The uterus is a normal size and  appearance for age without focal myometrial abnormalities.  The  endometrium is normal.  Their impression was that the mass on the right  side of the pelvis identified on previous CT scan is not identified on  ultrasound.  It appears that there is an atrophic right ovary in the  right adnexa with a nonvisualized left ovary and a normal-appearing  uterus.  They have recommended repeat CT scan for followup.  I have  spoken to the patient's sister, [name], regarding this.  They are very  pleased with  these results and are amenable to getting a followup CT scheduled.  I  will asked Caswell Corwin in our office to schedule this.  Should the  CT be negative, the issue of this enlarged right groin node is still  there, and that can be addressed by her general surgeon physician, Dr.  Dalbert Batman.      Imagene Gurney A. Alycia Rossetti, MD  Electronically Signed     PAG/MEDQ  D:  01/11/2008  T:  01/11/2008  Job:  [id number]   cc:   Lynnell Chad. Shelia Media, M.D.  Fax: [phone]   Edsel Petrin. Dalbert Batman, M.D.  [id number], [address]  [place]  [place] [address]   Waverly Ferrari, M.D.  Fax: [phone]   Bobbye Charleston,  M.D.  Fax: [phone]   Caswell Corwin, R.N.  [address], [place] [address]

## 2011-01-19 NOTE — Op Note (Signed)
Sara Hines, Sara Hines NO.:  [id number]   MEDICAL RECORD NO.:  [id number]          PATIENT TYPE:  AMB   LOCATION:  DAY                          FACILITY:  [facility]   PHYSICIAN:  Edsel Petrin. Dalbert Batman, M.D.DATE OF BIRTH:  December 30, 1934   DATE OF PROCEDURE:  02/27/2008  DATE OF DISCHARGE:                               OPERATIVE REPORT   PREOPERATIVE DIAGNOSIS:  Right femoral hernia.   POSTOPERATIVE DIAGNOSIS:  Right femoral hernia.   OPERATION PERFORMED:  Preperitoneal, laparoscopic repair of right  femoral hernia with mesh.   SURGEON:  Dr. Fanny Skates.   OPERATIVE INDICATIONS:  This is a 76 year old white female who had a  right inguinal hernia repair in 1997.  I do not have the exact details  of that, but most likely mesh was used.  She had a big workup lately  including colonoscopy, CT scans, gyn evaluation for adnexal mass which  has resolved.  There was no specific abnormal finding.  She was sent to  me regarding her hernia.  On exam she has a transverse incision in the  right groin.  I do not really feel an inguinal hernia, but she does have  a small femoral hernia that I could reduce.  She is symptomatic and  wants to have this repaired.  She is brought to operating room  electively.   OPERATIVE TECHNIQUE:  Following the induction of general endotracheal  anesthesia, a Foley catheter was inserted, the bladder was emptied.  The  Foley was taped to the leg and a Foley balloon deflated.  Intravenous  antibiotics were given.  The patient was identified as correct patient  and correct procedure and correct site.  The abdomen was prepped and  draped in a sterile fashion.  0.5% Marcaine with epinephrine was used as  local infiltration anesthetic.  Short transverse incision was made just  below the umbilicus.  The anterior rectus sheath was incised  transversely and we exposed the medial border of the right rectus  muscle.  We were able to bluntly dissect behind the  right rectus muscle  and insert the balloon inflator in the midline all the way down to the  symphysis pubis.  The video cam was inserted.  The balloon was slowly  inflated under direct vision.  It deployed well.  We could see the  rectus muscles anteriorly, preperitoneal fat posteriorly and the  symphysis pubis and Cooper's ligaments ultimately came into view.  On  the right side the inferior epigastric vessels actually dissected  inferiorly and posteriorly.  Both of them remaining anterior.  We held  the balloon in place for about 5 minutes, deflated the balloon and  removed it.  We then inflated the trocar balloon inside the fascia and  secured that.  We connected the trocar to the insufflator at 12 mmHg and  inflated the preperitoneal space.  Video cam was inserted.  We had  minimal bleeding.  We could see the symphysis pubis and bilateral  Cooper's ligaments.  We could see the rectus muscles anteriorly.  We  placed a 5-mm trocar in the midline  just below the umbilicus.  We used  that to clean the peritoneum off on the left and on the right.  We then  inserted a 5-mm trocar in the left lower quadrant as another working  port.  We took all of the peritoneum down laterally.  Because the  inferior epigastric vessels were intimately associated with the round  ligament, we had to isolate these, controlled them with metal clips and  divided them.  We also had to divide the round ligament.  There was a  large lipoma in the femoral hernia space.  We dissected that out.  There  was no evidence of indirect hernia.  We could see some scar tissue from  the previous repair.  We felt that we defined both femoral and inguinal  spaces and there was no further hernia.  We inserted a large size piece  of Bard, 3-D Max mesh.  We positioned this over the right inguinal and  femoral areas so as to overlap the midline medially, Cooper's ligament  inferiorly, and laterally to extend well lateral to the  internal ring.  We secured the mesh with about twelve 5 mm screw tacks on the superior  rim of the Cooper's ligament.  We placed about 4 tacks up the midline, 2  more tacks across the rectus muscle with 2 two more tacks and laterally  about 3 tacks.  Laterally we made sure that we could palpate the tacker  through the abdominal wall to make sure that all the tacks stayed well  above the iliopubic tract.  The mesh looked well positioned and secured.  There was no bleeding.  We irrigated a little bit, everything looked  fine.  The trocars were removed and the pneumoperitoneum was released.  The fascia at the umbilicus was closed with two interrupted figure-of-  eight sutures of 0 Vicryl and skin incisions closed with interrupted  subcuticular sutures of 4-0 Monocryl and Dermabond.  Clean bandages were  placed and the patient taken recovery range of motion in stable  condition.  Estimated blood loss was about 20 mL.  Complications none.  Sponge, needle and instrument counts were correct.      Edsel Petrin. Dalbert Batman, M.D.  Electronically Signed     HMI/MEDQ  D:  02/27/2008  T:  02/27/2008  Job:  [id number]   cc:   Lynnell Chad. Shelia Media, M.D.  Fax: [phone]   Waverly Ferrari, M.D.  Fax: [phone]

## 2011-01-19 NOTE — Op Note (Signed)
Sara Hines, Sara Hines NO.:  [id number]   MEDICAL RECORD NO.:  [id number]          PATIENT TYPE:  AMB   LOCATION:  ENDO                         FACILITY:  [facility]   PHYSICIAN:  Waverly Ferrari, M.D.    DATE OF BIRTH:  [birth year]   DATE OF PROCEDURE:  12/14/2007  DATE OF DISCHARGE:                               OPERATIVE REPORT   PROCEDURE:  Colonoscopy.   INDICATIONS:  Right-sided abdominal pain.   ANESTHESIA:  Fentanyl 80 mcg, Versed 8 mg.   DESCRIPTION OF PROCEDURE:  With the patient mildly sedated in the left  lateral decubitus position the Olympus videoscopic pediatric colonoscope  was inserted in the rectum and passed under direct vision with pressure  applied through a very severely diverticula filled sigmoid colon that  was very tortuous.  We were able to advance this subsequently to the  cecum, identified by base of cecum and ileocecal valve both of which  were photographed. From this point colonoscope was slowly withdrawn  taking circumferential views of colonic mucosa, stopping only then in  the rectum which appeared normal indirect and showed hemorrhoids on  retroflexed view. The endoscope was straightened and withdrawn.  The  patient's vital signs, pulse oximeter remained stable.  The patient  tolerated procedure well without apparent complications.   FINDINGS:  Significant diverticulosis, rather severe, with tortuosity  and thickening of the sigmoid colon and internal hemorrhoids were also  noted. Otherwise unremarkable examination.   PLAN:  Have patient follow-up with me as an outpatient.           ______________________________  Waverly Ferrari, M.D.     GMO/MEDQ  D:  12/14/2007  T:  12/14/2007  Job:  [id number]   cc:   Edsel Petrin. Dalbert Batman, M.D.  [id number], [place]  [place] [address]

## 2011-06-03 LAB — COMPREHENSIVE METABOLIC PANEL
AST: 17
Albumin: 3.5
Alkaline Phosphatase: 37 — ABNORMAL LOW
Chloride: 107
Creatinine, Ser: 0.76
GFR calc Af Amer: >60
Potassium: 4.3
Total Bilirubin: 0.8
Total Protein: 6.4

## 2011-06-03 LAB — DIFFERENTIAL
Basophils Absolute: 0
Eosinophils Relative: 1
Lymphocytes Relative: 40
Monocytes Absolute: 0.4
Monocytes Relative: 7

## 2011-06-03 LAB — URINALYSIS, ROUTINE W REFLEX MICROSCOPIC
Glucose, UA: NEGATIVE
Leukocytes, UA: NEGATIVE
Specific Gravity, Urine: 1.009
pH: 7

## 2011-06-03 LAB — CBC
Platelets: 247
RDW: 14.1
WBC: 6.2

## 2011-06-03 LAB — URINE MICROSCOPIC-ADD ON

## 2012-03-21 ENCOUNTER — Telehealth: Payer: Self-pay | Admitting: *Deleted

## 2012-03-21 ENCOUNTER — Encounter: Payer: Self-pay | Admitting: *Deleted

## 2012-03-21 ENCOUNTER — Other Ambulatory Visit: Payer: Self-pay | Admitting: Oncology

## 2012-03-21 DIAGNOSIS — C50919 Malignant neoplasm of unspecified site of unspecified female breast: Secondary | ICD-10-CM

## 2012-03-21 NOTE — Telephone Encounter (Signed)
PT REPORTS THAT PCP, DR PHARR, PT SHOULD BE ON "SOME TYPE ADJUNCTIVE THERAPY" PT HAS NOT BEEN SEEN IN THIS OFFICE IN 2 YEARS. MESSAGE SENT TO DR Truddie Coco FOR FURTHER PLAN OF CARE

## 2012-03-23 ENCOUNTER — Telehealth: Payer: Self-pay | Admitting: *Deleted

## 2012-03-23 ENCOUNTER — Other Ambulatory Visit: Payer: Self-pay | Admitting: *Deleted

## 2012-03-23 DIAGNOSIS — Z1231 Encounter for screening mammogram for malignant neoplasm of breast: Secondary | ICD-10-CM

## 2012-03-23 NOTE — Telephone Encounter (Signed)
left voice message to inform the patient of the new date and time of the mammogram at the breast center 03-24-2012 at 12:30pm

## 2012-03-23 NOTE — Telephone Encounter (Signed)
referring md called in the get the patient an appointment gave the nurse the date and time of the appointment

## 2012-03-24 ENCOUNTER — Ambulatory Visit: Payer: Medicare Other

## 2012-03-30 ENCOUNTER — Ambulatory Visit: Payer: Medicare Other | Admitting: Oncology

## 2012-03-30 ENCOUNTER — Other Ambulatory Visit: Payer: Medicare Other | Admitting: Lab

## 2012-06-07 ENCOUNTER — Telehealth: Payer: Self-pay | Admitting: Hematology & Oncology

## 2012-06-07 NOTE — Telephone Encounter (Signed)
Called pt to schedule appointment she wants to go to [facility]. I talked to Maudie Mercury in medical records she will call pt with appointment, I faxed records to her. She is aware it is a emergent referral. Pt aware [facility] will call her.

## 2012-06-09 ENCOUNTER — Telehealth: Payer: Self-pay | Admitting: *Deleted

## 2012-06-09 NOTE — Telephone Encounter (Signed)
Called pt to schedule an appt and daughter stated that she was not there at the moment.  Called another number the daughter gave me and I could not leave a voicemail - mailbox not set up yet.  Will try again later.

## 2012-06-09 NOTE — Telephone Encounter (Signed)
Confirmed 07/06/12 appt w/ pt.  Mailed before appt letter & packet to pt.  Called referring to make aware.  Took paperwork to Med Rec for chart.

## 2012-06-30 ENCOUNTER — Other Ambulatory Visit: Payer: Self-pay | Admitting: *Deleted

## 2012-06-30 DIAGNOSIS — C50219 Malignant neoplasm of upper-inner quadrant of unspecified female breast: Secondary | ICD-10-CM

## 2012-07-06 ENCOUNTER — Ambulatory Visit (HOSPITAL_BASED_OUTPATIENT_CLINIC_OR_DEPARTMENT_OTHER): Payer: Medicare Other | Admitting: Oncology

## 2012-07-06 ENCOUNTER — Ambulatory Visit: Payer: Medicare Other

## 2012-07-06 ENCOUNTER — Encounter: Payer: Self-pay | Admitting: Oncology

## 2012-07-06 ENCOUNTER — Other Ambulatory Visit (HOSPITAL_BASED_OUTPATIENT_CLINIC_OR_DEPARTMENT_OTHER): Payer: Medicare Other

## 2012-07-06 VITALS — BP 144/80 | HR 99 | Temp 98.5°F | Resp 20 | Ht 65.5 in | Wt 117.4 lb

## 2012-07-06 DIAGNOSIS — Z170 Estrogen receptor positive status [ER+]: Secondary | ICD-10-CM

## 2012-07-06 DIAGNOSIS — C50919 Malignant neoplasm of unspecified site of unspecified female breast: Secondary | ICD-10-CM

## 2012-07-06 DIAGNOSIS — C50219 Malignant neoplasm of upper-inner quadrant of unspecified female breast: Secondary | ICD-10-CM

## 2012-07-06 LAB — COMPREHENSIVE METABOLIC PANEL (CC13)
AST: 20 U/L (ref 5–34)
Albumin: 4.1 g/dL (ref 3.5–5.0)
Alkaline Phosphatase: 74 U/L (ref 40–150)
BUN: 10 mg/dL (ref 7.0–26.0)
Creatinine: 1.2 mg/dL — ABNORMAL HIGH (ref 0.6–1.1)
Glucose: 154 mg/dl — ABNORMAL HIGH (ref 70–99)
Total Bilirubin: 0.48 mg/dL (ref 0.20–1.20)

## 2012-07-06 LAB — CBC WITH DIFFERENTIAL/PLATELET
Basophils Absolute: 0.1 10*3/uL (ref 0.0–0.1)
EOS%: 2.3 % (ref 0.0–7.0)
Eosinophils Absolute: 0.1 10*3/uL (ref 0.0–0.5)
HCT: 36.7 % (ref 34.8–46.6)
HGB: 12.8 g/dL (ref 11.6–15.9)
LYMPH%: 46.1 % (ref 14.0–49.7)
MCH: 33 pg (ref 25.1–34.0)
MCV: 94.9 fL (ref 79.5–101.0)
MONO%: 7.8 % (ref 0.0–14.0)
NEUT#: 2.6 10*3/uL (ref 1.5–6.5)
NEUT%: 42.8 % (ref 38.4–76.8)
Platelets: 268 10*3/uL (ref 145–400)
RDW: 14.1 % (ref 11.2–14.5)

## 2012-07-06 MED ORDER — ANASTROZOLE 1 MG PO TABS: 1.0000 mg | ORAL_TABLET | Freq: Every day | ORAL | Status: DC

## 2012-07-06 NOTE — Progress Notes (Signed)
Patient History and Physical   Sara Hines [id number] 03/19/1935 77 y.o. 07/06/2012  CC: Dr Lang Snow  Chief Complaint: Hx of brast cancer  HPI:  This is a 77 yo woman who previously was diagnosed with breast cancer in 06/2010.This was ER+PR+ as well as her2+. She underwent a mastectomy on 07/24/10. Pathology showed a 1.7 cm tumor. Surgical margins were clear. A total of 2 lymph nodes were involved with 3 negative lymph nodes. At that time the patient refused any further adjuvant therapy in terms of chemotherapy, ideation and/or hormonal therapy . She has not had any further imaging studies of her breast. She has been doing well. She has had some problems with irregular heartbeat and has been on a beta blocker. At the time of initial diagnosis I did see the patient at which point she agreed to undergo adjuvant treatment. She had a port placed which was subsequently removed. On 10/28/2010 she had removal of a tissue expander and placement of a saline implant. The port removed at that time. PMH: History hypertension hypothyroidism heart catheterization secondary to her irregular heartbeat Past surgical history Hernia repair x2 mastoid surgeries shoulder surgery Allergies: None  Medications: Medications list reviewed  Social History:   does not have a smoking history on file. She does not have any smokeless tobacco history on file. Her alcohol and drug histories not on file. She is one of 8 children. Of note a history of breast ovarian cancer in the family.). She's been married 3 times. She has a total of 4 children, and grandchildren and 12 great-grandchildren. She is currently widowed component. 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  Family History: No history of breast ovarian cancer Reproductive History G4 P4 medication of menopause age 81 on Prempro for approximately 15 years.  Review of Systems: Constitutional ROS: Fever no,  Chills, Night Sweats, Anorexia, Pain none He denies any complaints. She feels well appetite good weight stable Remaining ROS negative.  Physical Exam: Blood pressure 144/80, pulse 99, temperature 98.5 F (36.9 C), temperature source Oral, resp. rate 20, height 5' 5.5" (1.664 m), weight 117 lb 6.4 oz (53.252 kg). HEENT:  Sclerae anicteric, conjunctivae pink.  Oropharynx clear.  No mucositis or candidiasis.  Nodes:  No cervical, supraclavicular, or axillary lymphadenopathy palpated.  Breast Exam:  Right breast is absent. There is an implant in place. There is no obvious regional recurrences noted..  Left breast is benign.  No masses, discharge, skin change, or nipple inversion..  Lungs:  Clear to auscultation bilaterally.  No crackles, rhonchi, or wheezes.  Heart:  Regular rate and rhythm.  Abdomen:  Soft, nontender.  Positive bowel sounds.  No organomegaly or masses palpated.  Musculoskeletal:  No focal spinal tenderness to palpation.  Extremities:  Benign.  No peripheral edema or cyanosis.  Skin:  Benign.  Neuro:  Nonfocal.   Lab Results: Lab Results  Component Value Date   WBC 6.0 07/06/2012   HGB 12.8 07/06/2012   HCT 36.7 07/06/2012   MCV 94.9 07/06/2012   PLT 268 07/06/2012     Chemistry      Component Value Date/Time   NA 134* 07/06/2012 1554   NA 139 10/26/2010 1218   K 4.1 07/06/2012 1554   K 4.0 10/26/2010 1218   CL 100 07/06/2012 1554   CL 105 10/26/2010 1218   CO2 24 07/06/2012 1554   CO2 27 10/26/2010 1218   BUN 10.0 07/06/2012 1554  BUN 10 10/26/2010 1218   CREATININE 1.2* 07/06/2012 1554   CREATININE 0.89 10/26/2010 1218      Component Value Date/Time   CALCIUM 10.2 07/06/2012 1554   CALCIUM 9.6 10/26/2010 1218   ALKPHOS 74 07/06/2012 1554   ALKPHOS 57 07/22/2010 1234   AST 20 07/06/2012 1554   AST 23 07/22/2010 1234   ALT 9 07/06/2012 1554   ALT 12 07/22/2010 1234   BILITOT 0.48 07/06/2012 1554   BILITOT 0.4 07/22/2010 1234       Radiological  Studies: None  Impression and Plan: This is a 78 year old woman with a previous history of node positive ER/PR positive HER-2 positive breast cancer status post mastectomy with no adjuvant treatment and/or followup imaging. This patient and I have had previous discussions when she was first diagnosed with adjuvant treatment. She there were refractory to any thoughts of imaging and/or treatment. I did manage to convince her to get a mammogram of her left breast. I've also asked to convince her to start an AI given her increased risk for recurrence. I discussed side effects of the medication I sent prescriptions were no pharmacy. I plan to see her in 3 months for followup. She is totally she does get a bone density test. As she does take vitamin D orally. I did mention some of the potential side effects of taking an AI. Hopefully she'll be compliant.  50 minutes spent with the patient half the time and patient-related counseling     Sara Daidone, MD 07/06/2012, 11:56 PM

## 2012-07-06 NOTE — Progress Notes (Signed)
Checked in new patient. No financial issues. °

## 2012-07-07 ENCOUNTER — Telehealth: Payer: Self-pay | Admitting: *Deleted

## 2012-07-07 NOTE — Telephone Encounter (Signed)
Gave patient appointment for 08-16-2012 at the breast center arrival time 11:40am  Gave patient appointment for lab one week before the md appointment

## 2012-07-10 ENCOUNTER — Encounter: Payer: Self-pay | Admitting: *Deleted

## 2012-07-10 NOTE — Progress Notes (Signed)
Mailed after appt letter to pt. 

## 2012-08-16 ENCOUNTER — Ambulatory Visit: Payer: Medicare Other

## 2012-10-05 ENCOUNTER — Encounter: Payer: Self-pay | Admitting: *Deleted

## 2012-10-05 ENCOUNTER — Encounter: Payer: Self-pay | Admitting: Oncology

## 2012-10-05 ENCOUNTER — Other Ambulatory Visit: Payer: Medicare Other | Admitting: Lab

## 2012-10-05 NOTE — Progress Notes (Unsigned)
Called and spoke with patient about rescheduling her appt. Patient insists on coming in April.  Confirmed appt. For 12/11/12 at 130 with Dr. Jana Hakim.

## 2012-10-12 ENCOUNTER — Ambulatory Visit: Payer: Medicare Other | Admitting: Oncology

## 2012-11-28 ENCOUNTER — Telehealth: Payer: Self-pay | Admitting: Cardiology

## 2012-11-28 NOTE — Telephone Encounter (Signed)
Spoke with patient. Have not received records from Dr Shelia Media.

## 2012-11-28 NOTE — Telephone Encounter (Signed)
Follow Up   Pt is following up on some papers that should have been faxed over from [facility] Dr. Darliss Ridgel.

## 2012-12-11 ENCOUNTER — Ambulatory Visit (HOSPITAL_BASED_OUTPATIENT_CLINIC_OR_DEPARTMENT_OTHER): Payer: Medicare Other | Admitting: Oncology

## 2012-12-11 VITALS — BP 105/69 | HR 90 | Temp 97.5°F | Resp 20 | Ht 65.0 in | Wt 120.5 lb

## 2012-12-11 DIAGNOSIS — C773 Secondary and unspecified malignant neoplasm of axilla and upper limb lymph nodes: Secondary | ICD-10-CM

## 2012-12-11 DIAGNOSIS — C50911 Malignant neoplasm of unspecified site of right female breast: Secondary | ICD-10-CM

## 2012-12-11 DIAGNOSIS — Z170 Estrogen receptor positive status [ER+]: Secondary | ICD-10-CM

## 2012-12-11 DIAGNOSIS — M05621 Rheumatoid arthritis of right elbow with involvement of other organs and systems: Secondary | ICD-10-CM

## 2012-12-11 DIAGNOSIS — C50019 Malignant neoplasm of nipple and areola, unspecified female breast: Secondary | ICD-10-CM

## 2012-12-11 NOTE — Progress Notes (Signed)
ID: Sara Hines   DOB: [birth year]  MR#: [id number]  [id number]  PCP: Deland Pretty GYN: Bobbye Charleston SU: Fanny Skates OTHER MD: Arloa Koh, Crissie Reese   HISTORY OF PRESENT ILLNESS: From Dr. Rada Hay 07/13/2010 note:  "This woman has had annual screening mammography.  She had a screening mammogram on 06/04/2010, calcifications were seen in the right breast.  A diagnostic view was recommended.  On physical exam at that time, she was noted to have hardness around the right areola with thickening.  Ultrasound showed no definitive right breast mass, but the right areola was difficult to evaluate.  MagView showed some calcifications in the upper right breast without associated mass. She underwent a biopsy of the right breast on 06/29/2010.  This showed invasive ductal cancer with lymphovascular invasion and neural invasion, this was ER positive 100%, PR positive 100%, proliferative index 38%, and HER-2 was amplified with a ratio of 2.5.  The patient did undergo an MRI scan of both breasts on 07/06/2010.  This showed a protruding 1.7 x 1.5 x 2 cm right subareolar mass, thickening in the half right nipple areolar complex.  No enlarged lymph nodes were seen.  No abnormal enhancements seen in the left breast."  The patient's subsequent history is as detailed below.  INTERVAL HISTORY: Sara Hines returns for followup of her breast cancer. She is establishing herself in my service today.  REVIEW OF SYSTEMS: She is tolerating the anastrozole with no side effects that she is aware of. She is not sure how much she pays for it but she tells me it's very little and she gets it at [facility]. She denies unusual headaches, visual changes, dizziness, nausea, vomiting, cough, phlegm production, pleurisy, worsening shortness of breath, change in bowel or bladder habits, or any persistent pain, fever, rash, or bleeding problems. A detailed review of systems today was generally negative  PAST MEDICAL  HISTORY: No past medical history on file. Hypertension, hypothyroidism, history of irregular heartbeat  PAST SURGICAL HISTORY: No past surgical history on file. Status post herniorrhaphy; status post left mastectomy as noted above  FAMILY HISTORY No family history on file. Patient's father died at the age of 82. Patient's mother died at the age of 52 following a stroke. The patient had 4 brothers, 3 sisters. There is no history of breast or ovarian cancer in the family to her knowledge.  GYNECOLOGIC HISTORY: Menarche age 5, first live birth age 85, she is Sara Hines P4, went through the change of life approximately age 72. She took birth control pills remotely, with no complications.  SOCIAL HISTORY: The patient used to work in an assisted living setting, mostly doing cleaning. Her husband died from complications of congestive heart failure in 2010. Sara Hines lives by herself. Her children are Sara Hines, who lives in [place] and works as a Pharmacologist; Sara Hines who works for [employer] in [place]; Sara Hines, who works in an Conservation officer, historic buildings in [place]; and Sara Hines, who works in Engineer, mining and lives in [place]. The patient has 10 grandchildren and "a bunch" of great-grandchildren. She is a Psychologist, forensic  ADVANCED DIRECTIVES: Not in place. The patient tells me she intends to name her sister Sara Hines as healthcare power of attorney. Sara Hines can be reached at [phone]. The patient was given the upper [place] documents to complete and notarized today.  HEALTH MAINTENANCE: History  Substance Use Topics  . Smoking status: Not on file  . Smokeless tobacco: Not on file  . Alcohol Use: Not on file  Colonoscopy:  PAP:  Bone density:  Lipid panel:  Not on File  Current Outpatient Prescriptions  Medication Sig Dispense Refill  . anastrozole (ARIMIDEX) 1 MG tablet Take 1 tablet (1 mg total) by mouth daily.  30 tablet  11   No current facility-administered medications for this  visit.    OBJECTIVE: Middle-aged white woman who appears well Filed Vitals:   12/11/12 1318  BP: 105/69  Pulse: 90  Temp: 97.5 F (36.4 C)  Resp: 20     Body mass index is 20.05 kg/(m^2).    ECOG FS: 0  Sclerae unicteric Oropharynx clear No cervical or supraclavicular adenopathy Lungs no rales or rhonchi Heart regular rate and rhythm Abd soft, nontender, positive bowel sounds MSK no focal spinal tenderness, no peripheral edema Neuro: nonfocal, well oriented, some pressure of speech, impulsive affect Breasts: The right breast is status post mastectomy and reconstruction. There is no evidence of local recurrence. The right excellent is benign. The left breast is unremarkable.   LAB RESULTS: Lab Results  Component Value Date   WBC 6.0 07/06/2012   NEUTROABS 2.6 07/06/2012   HGB 12.8 07/06/2012   HCT 36.7 07/06/2012   MCV 94.9 07/06/2012   PLT 268 07/06/2012      Chemistry      Component Value Date/Time   NA 134* 07/06/2012 1554   NA 139 10/26/2010 1218   K 4.1 07/06/2012 1554   K 4.0 10/26/2010 1218   CL 100 07/06/2012 1554   CL 105 10/26/2010 1218   CO2 24 07/06/2012 1554   CO2 27 10/26/2010 1218   BUN 10.0 07/06/2012 1554   BUN 10 10/26/2010 1218   CREATININE 1.2* 07/06/2012 1554   CREATININE 0.89 10/26/2010 1218      Component Value Date/Time   CALCIUM 10.2 07/06/2012 1554   CALCIUM 9.6 10/26/2010 1218   ALKPHOS 74 07/06/2012 1554   ALKPHOS 57 07/22/2010 1234   AST 20 07/06/2012 1554   AST 23 07/22/2010 1234   ALT 9 07/06/2012 1554   ALT 12 07/22/2010 1234   BILITOT 0.48 07/06/2012 1554   BILITOT 0.4 07/22/2010 1234       Lab Results  Component Value Date   LABCA2 26 07/06/2012    No components found with this basename: [id number]    No results found for this basename: INR,  in the last 168 hours  Urinalysis    Component Value Date/Time   COLORURINE YELLOW 07/22/2010 [place] 07/22/2010 1232   LABSPEC 1.005 07/22/2010 1232    PHURINE 7.0 07/22/2010 [place] 07/22/2010 [place]* 07/22/2010 [place] 07/22/2010 [place] 07/22/2010 [place] 07/22/2010 1232   UROBILINOGEN 0.2 07/22/2010 1232   NITRITE NEGATIVE 07/22/2010 1232   LEUKOCYTESUR NEGATIVE 07/22/2010 1232    STUDIES: No results found. Last mammogram I am aware of date 64 October of 2011  ASSESSMENT: 78 y.o. Rafael Gonzalez woman status post right mastectomy and sentinel lymph node sampling 07/24/2010 for a pT1c pN1, stage IIA invasive ductal carcinoma, grade 2, estrogen and progesterone receptors both 100% positive, MIB-1 of 38%, and HER-2 amplified with a ratio by CISH of 2.50  (1) Oncotype score of 27 predicted a risk of distant recurrence of 18% if the patient's only systemic treatment was tamoxifen for 5 years  (2) the patient was advised she was at high risk for right chest wall and nodal recurrence given her skin  and lymphovascular invasion involvement, as well as her 2 of 5 lymph nodes involved. I do not find evidence that she received post mastectomy radiation  (3) patient refused chemotherapy and anti-HER-2 treatment.  (4) anastrozole started October 2013  PLAN: Korynn is doing remarkably well and I am delighted that at present there is no clinical evidence of active breast cancer. Patient is to continue the anastrozole at least to total 5 years, which will take [place] to October of 2018. Today she agreed to mammography, and we have set that up to [facility]. I have asked her call us for any symptoms that she thinks might be related to her history of breast cancer. Otherwise she'll see [place] again in 6 months.   Roxana Lai C    12/11/2012

## 2012-12-13 ENCOUNTER — Telehealth: Payer: Self-pay | Admitting: Oncology

## 2012-12-13 NOTE — Telephone Encounter (Signed)
S/w pt re appt for 10/7. Dx on mammo orders needs to be changed. Copy of order to desk nurse and Tami for dx change. [facility] is no longer willing to change dx.

## 2012-12-19 ENCOUNTER — Telehealth: Payer: Self-pay | Admitting: Oncology

## 2012-12-19 NOTE — Telephone Encounter (Signed)
S/w pt re appt for mammo @ [facility] 5/7 @ 1pm. Pt already aware of appt for lb/fu 5/7.

## 2012-12-29 ENCOUNTER — Encounter: Payer: Self-pay | Admitting: *Deleted

## 2013-01-01 ENCOUNTER — Encounter: Payer: Self-pay | Admitting: Cardiology

## 2013-01-01 ENCOUNTER — Ambulatory Visit (INDEPENDENT_AMBULATORY_CARE_PROVIDER_SITE_OTHER): Payer: Medicare Other | Admitting: Cardiology

## 2013-01-01 VITALS — BP 122/66 | HR 76 | Ht 65.0 in | Wt 119.0 lb

## 2013-01-01 DIAGNOSIS — I341 Nonrheumatic mitral (valve) prolapse: Secondary | ICD-10-CM | POA: Insufficient documentation

## 2013-01-01 DIAGNOSIS — I4891 Unspecified atrial fibrillation: Secondary | ICD-10-CM

## 2013-01-01 DIAGNOSIS — I059 Rheumatic mitral valve disease, unspecified: Secondary | ICD-10-CM

## 2013-01-01 NOTE — Progress Notes (Signed)
Patient ID: Sara Hines, female   DOB: [birth year], 78 y.o.   MRN: [id number] PCP: Dr. Shelia Media  78 yo with history of breast cancer s/p mastectomy and chronic atrial fibrillation returns for cardiology followup.  I saw her a number of years ago prior to her mastectomy (back in 2011).  She has been in atrial fibrillation chronically for a number of years now.  She had right mastectomy in 2011 and now is on anastrozole.  She initially refused chemo/radiation (3/5 lymph nodes positive) but is discussing further treatment with oncology now.    In general, she has been doing well symptomatically.  She walks for exercise and denies exertional dyspnea.  She can walk up a flight of steps without problems.  No chest pain, no lightheadedness, no syncope.  No history of stroke-like symptoms.  It sounds like she had not seen cardiology for a number of years so her PCP wanted her to be re-assessed given her chronic atrial fibrillation.  She was referred to [facility] cardiology but still had my card from years ago so came over here instead.    ECG: atrial fibrillation with rate 76, otherwise normal  PMH: 1. Breast cancer: Right mastectomy in 2011, ER/PR+, 3/5 lymph nodes positive.  Refused chemo/radiation, taking anastrozole.  2. Hypothyroidism 3. Hyperlipidemia 4. Mitral valve prolapse 5. Chronic atrial fibrillation: Not on anticoagulation.  6. Chronic hematuria 7. Diverticulosis 8. Depression 9. GERD 10. LHC (Dr. Glade Lloyd) in 1997 was normal.   SH: Widow, lives with son.  Has 4 children.  Lives in [place].  Not smoking.    FH: Father with atrial fibrillation, lived into his 28s.  Mother with dementia.  ROS: All systems reviewed and negative except as per HPI.   Current Outpatient Prescriptions  Medication Sig Dispense Refill  . anastrozole (ARIMIDEX) 1 MG tablet Take 1 tablet (1 mg total) by mouth daily.  30 tablet  11  . Calcium Carbonate-Vitamin D (CALCIUM-D) 600-400 MG-UNIT TABS Take 1  tablet by mouth daily.      . cetirizine (ZYRTEC) 10 MG tablet Take 10 mg by mouth daily.      Marland Kitchen diltiazem (CARDIZEM) 30 MG tablet Take 30 mg by mouth 2 (two) times daily.       . fish oil-omega-3 fatty acids 1000 MG capsule Take 1 g by mouth daily.      Marland Kitchen levothyroxine (SYNTHROID, LEVOTHROID) 88 MCG tablet Take 88 mcg by mouth daily before breakfast.       . losartan (COZAAR) 50 MG tablet Take 50 mg by mouth daily.       . pantoprazole (PROTONIX) 40 MG tablet Take 40 mg by mouth daily.       Marland Kitchen [place] 5-2.5-18.5 injection as directed.        No current facility-administered medications for this visit.    BP 122/66  Pulse 76  Ht 5\' 5"  (1.651 m)  Wt 119 lb (53.978 kg)  BMI 19.8 kg/m2 General: NAD Neck: No JVD, no thyromegaly or thyroid nodule.  Lungs: Clear to auscultation bilaterally with normal respiratory effort. CV: Nondisplaced PMI.  Heart irregular S1/S2, no S3/S4, 1/6 SEM.  No peripheral edema.  No carotid bruit.  Normal pedal pulses.  Abdomen: Soft, nontender, no hepatosplenomegaly, no distention.  Skin: Intact without lesions or rashes.  Neurologic: Alert and oriented x 3.  Psych: Normal affect. Extremities: No clubbing or cyanosis.  HEENT: Normal.   Assessment/Plan: 1. Atrial fibrillation: Chronic.  She is minimally symptomatic.  HR has been  historically well-controlled on her current diltiazem regimen, so will not change this.  CHADSVASC = 3 for age and gender.  Ideally, she would be on anticoagulation.  We talked today about starting Xarelto or Eliquis, which I recommended.  She does not want any form of anticoagulation at this time and understands her stroke risk.   2. Mitral valve prolapse: MVP by prior history, has soft systolic murmur.  She also has had atrial fibrillation for years.  I will get an echo to assess her mitral valve and to assess her LV function.   Followup in 1 year or earlier if she has any problems.   Loralie Champagne 01/01/2013 4:01 PM

## 2013-01-01 NOTE — Patient Instructions (Addendum)
Your physician has requested that you have an echocardiogram. Echocardiography is a painless test that uses sound waves to create images of your heart. It provides your doctor with information about the size and shape of your heart and how well your heart's chambers and valves are working. This procedure takes approximately one hour. There are no restrictions for this procedure.  Your physician wants you to follow-up in: 1 year with Dr Aundra Dubin. (April 2015). You will receive a reminder letter in the mail two months in advance. If you don't receive a letter, please call our office to schedule the follow-up appointment.

## 2013-01-03 ENCOUNTER — Ambulatory Visit (HOSPITAL_COMMUNITY): Payer: Medicare Other | Attending: Cardiovascular Disease | Admitting: Radiology

## 2013-01-03 DIAGNOSIS — I341 Nonrheumatic mitral (valve) prolapse: Secondary | ICD-10-CM

## 2013-01-03 DIAGNOSIS — I059 Rheumatic mitral valve disease, unspecified: Secondary | ICD-10-CM | POA: Insufficient documentation

## 2013-01-03 DIAGNOSIS — E785 Hyperlipidemia, unspecified: Secondary | ICD-10-CM | POA: Insufficient documentation

## 2013-01-03 DIAGNOSIS — I10 Essential (primary) hypertension: Secondary | ICD-10-CM | POA: Insufficient documentation

## 2013-01-03 DIAGNOSIS — I4891 Unspecified atrial fibrillation: Secondary | ICD-10-CM | POA: Insufficient documentation

## 2013-01-03 DIAGNOSIS — R011 Cardiac murmur, unspecified: Secondary | ICD-10-CM

## 2013-01-03 DIAGNOSIS — C50919 Malignant neoplasm of unspecified site of unspecified female breast: Secondary | ICD-10-CM | POA: Insufficient documentation

## 2013-01-03 NOTE — Progress Notes (Signed)
Echocardiogram performed.  

## 2013-01-08 ENCOUNTER — Telehealth: Payer: Self-pay | Admitting: Cardiology

## 2013-01-08 NOTE — Telephone Encounter (Signed)
New problem   Pt want to know if she still need to take Diltiazem 30mg . Please call pt.

## 2013-01-08 NOTE — Telephone Encounter (Signed)
Spoke with patient and she is aware she should continue Diltiazem

## 2013-02-05 ENCOUNTER — Encounter: Payer: Self-pay | Admitting: Oncology

## 2013-06-12 ENCOUNTER — Encounter: Payer: Self-pay | Admitting: Family

## 2013-06-12 ENCOUNTER — Telehealth: Payer: Self-pay | Admitting: *Deleted

## 2013-06-12 ENCOUNTER — Ambulatory Visit (HOSPITAL_BASED_OUTPATIENT_CLINIC_OR_DEPARTMENT_OTHER): Payer: Medicare Other | Admitting: Family

## 2013-06-12 ENCOUNTER — Other Ambulatory Visit (HOSPITAL_BASED_OUTPATIENT_CLINIC_OR_DEPARTMENT_OTHER): Payer: Medicare Other | Admitting: Lab

## 2013-06-12 VITALS — BP 124/78 | HR 68 | Temp 97.9°F | Resp 18 | Ht 65.0 in | Wt 120.3 lb

## 2013-06-12 DIAGNOSIS — Z853 Personal history of malignant neoplasm of breast: Secondary | ICD-10-CM

## 2013-06-12 DIAGNOSIS — C50019 Malignant neoplasm of nipple and areola, unspecified female breast: Secondary | ICD-10-CM

## 2013-06-12 DIAGNOSIS — Z170 Estrogen receptor positive status [ER+]: Secondary | ICD-10-CM

## 2013-06-12 DIAGNOSIS — Z9010 Acquired absence of unspecified breast and nipple: Secondary | ICD-10-CM

## 2013-06-12 DIAGNOSIS — C50911 Malignant neoplasm of unspecified site of right female breast: Secondary | ICD-10-CM

## 2013-06-12 LAB — COMPREHENSIVE METABOLIC PANEL (CC13)
AST: 18 U/L (ref 5–34)
Alkaline Phosphatase: 70 U/L (ref 40–150)
BUN: 10.4 mg/dL (ref 7.0–26.0)
Creatinine: 1.2 mg/dL — ABNORMAL HIGH (ref 0.6–1.1)
Potassium: 4.5 mEq/L (ref 3.5–5.1)
Total Bilirubin: 0.57 mg/dL (ref 0.20–1.20)

## 2013-06-12 LAB — CBC WITH DIFFERENTIAL/PLATELET
Basophils Absolute: 0.1 10*3/uL (ref 0.0–0.1)
EOS%: 2.2 % (ref 0.0–7.0)
HGB: 12.5 g/dL (ref 11.6–15.9)
LYMPH%: 51.6 % — ABNORMAL HIGH (ref 14.0–49.7)
MCH: 32.1 pg (ref 25.1–34.0)
MCV: 92.9 fL (ref 79.5–101.0)
MONO%: 8.8 % (ref 0.0–14.0)
NEUT%: 36 % — ABNORMAL LOW (ref 38.4–76.8)
Platelets: 276 10*3/uL (ref 145–400)
RDW: 13.9 % (ref 11.2–14.5)

## 2013-06-12 NOTE — Telephone Encounter (Signed)
appts made and printed. gv appt for [facility]...td 

## 2013-06-12 NOTE — Patient Instructions (Addendum)
Please contact us at [phone] if you have any questions or concerns.  Please continue to do well and enjoy life!!!  Get plenty of rest, drink plenty of water, exercise daily (walking), eat a balanced diet.  Continue to take all medications as prescribed.  Please take Anastrazole daily as prescribed.  Complete monthly self-breast examinations.  Have your mammogram completed every year.  Keep taking calcium and vitamin D3 daily.  Laboratory results: Results for orders placed in visit on 06/12/13 (from the past 24 hour(s))  CBC WITH DIFFERENTIAL     Status: Abnormal   Collection Time    06/12/13  9:48 AM      Result Value Range   WBC 5.3  3.9 - 10.3 10e3/uL   NEUT# 1.9  1.5 - 6.5 10e3/uL   HGB 12.5  11.6 - 15.9 g/dL   HCT 36.1  34.8 - 46.6 %   Platelets 276  145 - 400 10e3/uL   MCV 92.9  79.5 - 101.0 fL   MCH 32.1  25.1 - 34.0 pg   MCHC 34.5  31.5 - 36.0 g/dL   RBC 3.89  3.70 - 5.45 10e6/uL   RDW 13.9  11.2 - 14.5 %   lymph# 2.7  0.9 - 3.3 10e3/uL   MONO# 0.5  0.1 - 0.9 10e3/uL   Eosinophils Absolute 0.1  0.0 - 0.5 10e3/uL   Basophils Absolute 0.1  0.0 - 0.1 10e3/uL   NEUT% 36.0 (*) 38.4 - 76.8 %   LYMPH% 51.6 (*) 14.0 - 49.7 %   MONO% 8.8  0.0 - 14.0 %   EOS% 2.2  0.0 - 7.0 %   BASO% 1.4  0.0 - 2.0 %   Narrative:    Performed At:  [facility]               [address]               [place], [place] [address]  COMPREHENSIVE METABOLIC PANEL (0000000)     Status: Abnormal   Collection Time    06/12/13  9:48 AM      Result Value Range   Sodium 139  136 - 145 mEq/L   Potassium 4.5  3.5 - 5.1 mEq/L   Chloride 107  98 - 109 mEq/L   CO2 24  22 - 29 mEq/L   Glucose 95  70 - 140 mg/dl   BUN 10.4  7.0 - 26.0 mg/dL   Creatinine 1.2 (*) 0.6 - 1.1 mg/dL   Total Bilirubin 0.57  0.20 - 1.20 mg/dL   Alkaline Phosphatase 70  40 - 150 U/L   AST 18  5 - 34 U/L   ALT 7  0 - 55 U/L   Total Protein 7.6  6.4 - 8.3 g/dL   Albumin 3.8  3.5 - 5.0 g/dL   Calcium 10.1  8.4  - 10.4 mg/dL   Anion Gap 9  3 - 11 mEq/L   Narrative:    This order was split into 2orders: (CBC & Diff), (Comprehensive Metabolic Panel )Performed At:  [facility] [address].               [place], [place] [address]

## 2013-06-12 NOTE — Progress Notes (Signed)
[place]  [phone] Fax:([phone]) [phone]  OFFICE PROGRESS NOTE    ID: Sara Hines   DOB: 23-Jun-1935  MR#: [id number]  [id number]  PCP: Sara Pretty, MD GYN: Sara Charleston, MD SU: Sara Skates, MD  SU: Sara Reese, MD RAD ONC: Sara Koh, MD CARDIO:  Sara Hines, M.D.     HISTORY OF PRESENT ILLNESS: From Dr. Collier Salina Hines's 07/13/2010 note: "This woman has had annual screening mammography.  She had a screening mammogram on 06/04/2010, calcifications were seen in the right breast.  A diagnostic view was recommended.  On physical exam at that time, she was noted to have hardness around the right areola with thickening.  Ultrasound showed no definitive right breast mass, but the right areola was difficult to evaluate.  MagView showed some calcifications in the upper right breast without associated mass. She underwent a biopsy of the right breast on 06/29/2010.  This showed invasive ductal cancer with lymphovascular invasion and neural invasion, this was ER positive 100%, PR positive 100%, proliferative index 38%, and HER-2 was amplified with a ratio of 2.5.  The patient did undergo an MRI scan of both breasts on 07/06/2010.  This showed a protruding 1.7 x 1.5 x 2 cm right subareolar mass, thickening in the half right nipple areolar complex.  No enlarged lymph nodes were seen.  No abnormal enhancements seen in the left breast."   Her  subsequent history is as detailed below.   INTERVAL HISTORY: Sara Hines returns today for followup of her right breast invasive ductal carcinoma status post mastectomy.  She was last seen by Dr. Jana Hines on 12/11/2012.  Since her last office visit she states that she has been doing well.  Of concern is the patient stating she takes anastrozole "occasionally."  Medication noncompliance is stemming from a story told her from her friend with breast cancer that was taking antiestrogen medication and developed blood clots.   Spoke to the patient about how highly individualized breast cancer is and she cannot base her plan of care from her friend's breast cancer experience.  Her interval history is otherwise unremarkable.    REVIEW OF SYSTEMS: A 10 point review of systems was completed and is negative except for occasional constipation.  Sara Hines states she is tolerating the anastrozole with no side effects that she is aware of.  She denies any other symptomatology today including fatigue, fever or chills, headache, vision changes, swollen glands, cough or shortness of breath, chest pain or discomfort, nausea, vomiting, diarrhea, change in urinary or bowel habits, arthralgias/myalgias, unusual bleeding/bruising or any other symptomatology. A detailed review of systems today was generally negative and otherwise unremarkable.    PAST MEDICAL HISTORY: Past Medical History  Diagnosis Date  . Atrial fibrillation   . Hypothyroidism   . Unspecified essential hypertension   . Malignant neoplasm of breast (female), unspecified site     invasive duct, right breast  . Other and unspecified hyperlipidemia   . MVP (mitral valve prolapse)   . Bilateral cataracts   . Diverticulosis   . History of depression   . GERD (gastroesophageal reflux disease)   . Personal history of other disorder of urinary system   . Personal history of other disorders of nervous system and sense organs      PAST SURGICAL HISTORY: Past Surgical History  Procedure Laterality Date  . Left heart cath  1997  . Inguinal hernia repair  1997  . Total mastectomy  07/2010    right  .  Breast reconstruction  10/2010  . Right ear surgery    . Shoulder surgery       FAMILY HISTORY Family History  Problem Relation Age of Onset  . Atrial fibrillation Father   . Dementia Mother   . Diabetes Sister   . Healthy Brother   . Healthy Sister   . Healthy Sister   . Healthy Brother   . Healthy Brother   . Healthy Brother   Patient's father died at  the age of 14. Patient's mother died at the age of 93 following a stroke. The patient had 4 brothers, 3 sisters. There is no history of breast or ovarian cancer in the family to her knowledge.   GYNECOLOGIC HISTORY: Menarche age 54, first live birth age 59, she is G4 P4, went through the change of life approximately age 16. She took birth control pills remotely, with no complications.   SOCIAL HISTORY: The patient used to work in an assisted living setting, mostly doing cleaning. Her husband died from complications of congestive heart failure in 2010. Sara Hines lives by herself. Her children are Sara Hines, who lives in [place] and works as a Pharmacologist; Sara Hines who works for [employer] in [place]; Sara Hines, who works in an Conservation officer, historic buildings in [place]; and Sara Hines, who works in Engineer, mining and lives in [place]. The patient has 10 grandchildren and "a bunch" of great-grandchildren. She is a Psychologist, forensic.   ADVANCED DIRECTIVES: Not in place. The patient tells me she intends to name her sister Sara Hines as healthcare power of attorney.  Sara Hines can be reached at [phone]. The patient was given the [place] documents to complete and notarized today.   HEALTH MAINTENANCE: History  Substance Use Topics  . Smoking status: Former Research scientist (life sciences)  . Smokeless tobacco: Never Used     Comment: quit 07/2010  . Alcohol Use: No     Colonoscopy: Not on file  PAP: Not on file  Bone density: The patient states she had a bone density scan that her primary care physician's office in 2014-specific  results are unknown to her.  Lipid panel: Not on file  Allergies  Allergen Reactions  . Aspirin     Upset stomach   . Darvocet A500 [Propoxyphene N-Acetaminophen] Nausea Only  . Flagyl [Metronidazole] Nausea Only  . Ibuprofen     Upset stomach   . Phenol-Yellow Jacket Venom Protein [Bee Venom]     Hyperactive node    Current Outpatient Prescriptions  Medication Sig Dispense Refill    . anastrozole (ARIMIDEX) 1 MG tablet Take 1 tablet (1 mg total) by mouth daily.  30 tablet  11  . [place] 5-2.5-18.5 injection as directed.       . Calcium Carbonate-Vitamin D (CALCIUM-D) 600-400 MG-UNIT TABS Take 1 tablet by mouth daily.      . cetirizine (ZYRTEC) 10 MG tablet Take 10 mg by mouth daily.      Marland Kitchen diltiazem (CARDIZEM) 30 MG tablet Take 30 mg by mouth 2 (two) times daily.       . fish oil-omega-3 fatty acids 1000 MG capsule Take 1 g by mouth daily.      Marland Kitchen levothyroxine (SYNTHROID, LEVOTHROID) 88 MCG tablet Take 88 mcg by mouth daily before breakfast.       . losartan (COZAAR) 50 MG tablet Take 50 mg by mouth daily.       . pantoprazole (PROTONIX) 40 MG tablet Take 40 mg by mouth daily.  No current facility-administered medications for this visit.    OBJECTIVE: Middle-aged white woman who appears well: Filed Vitals:   06/12/13 0959  BP: 124/78  Pulse: 68  Temp: 97.9 F (36.6 C)  Resp: 18     Body mass index is 20.02 kg/(m^2).    ECOG FS: 0 - Asymptomatic   General appearance: Alert, cooperative, well nourished, thin frame, no apparent distress Head: Normocephalic, without obvious abnormality, atraumatic, HOH at times Eyes: Arcus senilis, PERRLA, EOMI Nose: Nares, septum and mucosa are normal, no drainage or sinus tenderness Neck: No adenopathy, supple, symmetrical, trachea midline, no tenderness Resp: Clear to auscultation bilaterally, no wheezes/rales/rhonchi Cardio: Regular rate and rhythm, S1, S2 normal, 2/6 murmur, no click, rub or gallop, no edema Breasts:  Right breast status post mastectomy with reconstruction, right breast has well-healed surgical scars, left breast has glandular tissue, left breast is visibly larger than the right breast, bilateral axillary fullness  GI: Soft, not distended, non-tender, hypoactive bowel sounds, no organomegaly Skin: No rashes/lesions, skin warm and dry, no erythematous areas, no cyanosis, numerous nevi on  trunk/face/cervical/extremities M/S:  Atraumatic, normal strength in all extremities, normal range of motion, mild finger clubbing bilaterally  Lymph nodes: Cervical, supraclavicular, and axillary nodes normal Neurologic: Grossly normal, cranial nerves II through XII intact, alert and oriented x 3 Psych: Appropriate affect   LAB RESULTS: Lab Results  Component Value Date   WBC 5.3 06/12/2013   NEUTROABS 1.9 06/12/2013   HGB 12.5 06/12/2013   HCT 36.1 06/12/2013   MCV 92.9 06/12/2013   PLT 276 06/12/2013      Chemistry      Component Value Date/Time   NA 139 06/12/2013 0948   NA 139 10/26/2010 1218   K 4.5 06/12/2013 0948   K 4.0 10/26/2010 1218   CL 100 07/06/2012 1554   CL 105 10/26/2010 1218   CO2 24 06/12/2013 0948   CO2 27 10/26/2010 1218   BUN 10.4 06/12/2013 0948   BUN 10 10/26/2010 1218   CREATININE 1.2* 06/12/2013 0948   CREATININE 0.89 10/26/2010 1218      Component Value Date/Time   CALCIUM 10.1 06/12/2013 0948   CALCIUM 9.6 10/26/2010 1218   ALKPHOS 70 06/12/2013 0948   ALKPHOS 57 07/22/2010 1234   AST 18 06/12/2013 0948   AST 23 07/22/2010 1234   ALT 7 06/12/2013 0948   ALT 12 07/22/2010 1234   BILITOT 0.57 06/12/2013 0948   BILITOT 0.4 07/22/2010 1234       Lab Results  Component Value Date   LABCA2 26 07/06/2012    No components found with this basename: [id number]    No results found for this basename: INR,  in the last 168 hours  Urinalysis    Component Value Date/Time   COLORURINE YELLOW 07/22/2010 [place] 07/22/2010 1232   LABSPEC 1.005 07/22/2010 1232   PHURINE 7.0 07/22/2010 [place] 07/22/2010 1232   HGBUR SMALL* 07/22/2010 1232   BILIRUBINUR NEGATIVE 07/22/2010 1232   KETONESUR NEGATIVE 07/22/2010 1232   PROTEINUR NEGATIVE 07/22/2010 1232   UROBILINOGEN 0.2 07/22/2010 1232   NITRITE NEGATIVE 07/22/2010 1232   LEUKOCYTESUR NEGATIVE 07/22/2010 1232    STUDIES: 1. unilateral left digital diagnostic mammogram on  01/10/2013 showed a left breast is heterogeneously dense which is an independent risk factor for breast cancer and which also decreases mammographic sensitivity.  The breast parenchymal pattern is stable with no new or worrisome finding in the left breast, benign  findings.   ASSESSMENT: 78 y.o. [place], [place] woman status post right mastectomy and sentinel lymph node sampling on 07/24/2010 for a pT1c pN1, stage IIA invasive ductal carcinoma, grade 2, estrogen and progesterone receptors both 100% positive, MIB-1 of 38%, and HER-2 amplified with a ratio by CISH of 2.50  (1) Oncotype score of 27 predicted a risk of distant recurrence of 18% if the patient's only systemic treatment was Tamoxifen for 5 years.  (2) The patient was advised she was at high risk for right chest wall and nodal recurrence given her skin and lymphovascular invasion involvement, as well as her 2 of 5 lymph nodes involved. I do not find evidence that she received post mastectomy radiation.  (3) Patient refused chemotherapy and anti-HER-2 treatment.  (4) Anastrozole started October 2013.   PLAN:  Medication compliance with taking Anastrozole 1 mg by mouth daily was emphasized to the patient who states she takes Anastrozole occasionally.  She states she does not need a refill on this medication currently.  Her continued daily exercise (walking) in addition to taking calcium with vitamin D daily was encouraged.  We will schedule her annual unilateral left screening mammogram for her due in 01/2014.  Her current laboratory results were reviewed with her and she was provided a copy of her laboratory results.  We plan to see Ms. Dye again in 01/23/14 after her annual mammogram.  We will check laboratories of CBC, CMP, and vitamin D level at that time.  All questions answered.  Ms. Florentino was encouraged to contact us in the interim with any questions, concerns, or problems.   Ailene Ards, NP-C 06/12/2013  10:59  AM

## 2014-01-02 ENCOUNTER — Ambulatory Visit (INDEPENDENT_AMBULATORY_CARE_PROVIDER_SITE_OTHER): Payer: Medicare Other | Admitting: Cardiology

## 2014-01-02 ENCOUNTER — Encounter: Payer: Self-pay | Admitting: Cardiology

## 2014-01-02 ENCOUNTER — Telehealth: Payer: Self-pay | Admitting: Oncology

## 2014-01-02 VITALS — BP 138/82 | HR 79 | Ht 65.0 in | Wt 119.0 lb

## 2014-01-02 DIAGNOSIS — C50919 Malignant neoplasm of unspecified site of unspecified female breast: Secondary | ICD-10-CM

## 2014-01-02 DIAGNOSIS — C50911 Malignant neoplasm of unspecified site of right female breast: Secondary | ICD-10-CM

## 2014-01-02 DIAGNOSIS — I4891 Unspecified atrial fibrillation: Secondary | ICD-10-CM

## 2014-01-02 DIAGNOSIS — I059 Rheumatic mitral valve disease, unspecified: Secondary | ICD-10-CM

## 2014-01-02 NOTE — Telephone Encounter (Signed)
, °

## 2014-01-02 NOTE — Progress Notes (Signed)
Patient ID: Sara Hines, female   DOB: 09-Oct-1934, 79 y.o.   MRN: [id number] PCP: Dr. Shelia Media  79 yo with history of breast cancer s/p mastectomy and chronic atrial fibrillation returns for cardiology followup.  She has been in atrial fibrillation chronically for a number of years now.  She has refused anticoagulation and says that she "just can't take" aspirin.    In general, she has been doing well symptomatically.  She walks for exercise and denies exertional dyspnea.  She can walk up a flight of steps without problems.  No chest pain, no lightheadedness, no syncope.  No history of stroke-like symptoms.  She was on anastrozole for breast cancer but stopped it. She has not told her oncologist this.  Weight is stable.   ECG: atrial fibrillation with rate 79, otherwise normal  PMH: 1. Breast cancer: Right mastectomy in 2011, ER/PR+, 3/5 lymph nodes positive.  Refused chemo/radiation, took anastrozole for a while but has now stopped it.  2. Hypothyroidism 3. Hyperlipidemia 4. Mitral valve prolapse 5. Chronic atrial fibrillation: Not on anticoagulation. Echo (4/14) with EF 60-65%, trivial MR, normal RV size and systolic function.  6. Chronic hematuria 7. Diverticulosis 8. Depression 9. GERD 10. LHC (Dr. Glade Lloyd) in 1997 was normal.   SH: Widow, lives with son.  Has 4 children.  Lives in [place].  Not smoking.    FH: Father with atrial fibrillation, lived into his 55s.  Mother with dementia.  Current Outpatient Prescriptions  Medication Sig Dispense Refill  . Calcium Carbonate-Vitamin D (CALCIUM-D) 600-400 MG-UNIT TABS Take 1 tablet by mouth daily.      . cetirizine (ZYRTEC) 10 MG tablet Take 10 mg by mouth daily.      Marland Kitchen diltiazem (CARDIZEM) 30 MG tablet Take 30 mg by mouth 2 (two) times daily.       . fish oil-omega-3 fatty acids 1000 MG capsule Take 1 g by mouth daily.      Marland Kitchen levothyroxine (SYNTHROID, LEVOTHROID) 88 MCG tablet Take 88 mcg by mouth daily before breakfast.       .  losartan (COZAAR) 50 MG tablet Take 50 mg by mouth daily.        No current facility-administered medications for this visit.    BP 138/82  Pulse 79  Ht 5\' 5"  (1.651 m)  Wt 53.978 kg (119 lb)  BMI 19.80 kg/m2 General: NAD Neck: No JVD, no thyromegaly or thyroid nodule.  Lungs: Clear to auscultation bilaterally with normal respiratory effort. CV: Nondisplaced PMI.  Heart irregular S1/S2, no S3/S4, no murmur.  No peripheral edema.  No carotid bruit.  Normal pedal pulses.  Abdomen: Soft, nontender, no hepatosplenomegaly, no distention.  Skin: Intact without lesions or rashes.  Neurologic: Alert and oriented x 3.  Psych: Normal affect. Extremities: No clubbing or cyanosis.  HEENT: Normal.   Assessment/Plan: 1. Atrial fibrillation: Chronic.  She is minimally symptomatic.  EF preserved on echo in 4/14.  HR has been historically well-controlled on her current diltiazem regimen, so will not change this.  CHADSVASC = 3 for age and gender.  Ideally, she would be on anticoagulation.  I again recommended a NOAC. She does not want any form of anticoagulation at this time and understands her stroke risk.  She also says that she cannot take aspirin.  2. Breast cancer: She quit taking anastrozole.  I encouraged her to inform her oncologist.   Larey Dresser 01/02/2014

## 2014-01-02 NOTE — Patient Instructions (Signed)
Your physician wants you to follow-up in: 1 year with Dr Aundra Dubin. (April 2016).  You will receive a reminder letter in the mail two months in advance. If you don't receive a letter, please call our office to schedule the follow-up appointment.

## 2014-01-04 ENCOUNTER — Telehealth: Payer: Self-pay | Admitting: Oncology

## 2014-01-04 NOTE — Telephone Encounter (Signed)
per GM to move appt/cld pt & left a message for pt for new time & date for appt

## 2014-01-17 ENCOUNTER — Ambulatory Visit: Payer: Medicare Other | Admitting: Oncology

## 2014-01-17 ENCOUNTER — Other Ambulatory Visit: Payer: Medicare Other

## 2014-03-13 ENCOUNTER — Other Ambulatory Visit: Payer: Self-pay | Admitting: *Deleted

## 2014-03-13 DIAGNOSIS — C50911 Malignant neoplasm of unspecified site of right female breast: Secondary | ICD-10-CM

## 2014-03-14 ENCOUNTER — Other Ambulatory Visit (HOSPITAL_BASED_OUTPATIENT_CLINIC_OR_DEPARTMENT_OTHER): Payer: Medicare Other

## 2014-03-14 ENCOUNTER — Ambulatory Visit (HOSPITAL_BASED_OUTPATIENT_CLINIC_OR_DEPARTMENT_OTHER): Payer: Medicare Other | Admitting: Oncology

## 2014-03-14 VITALS — BP 100/63 | HR 85 | Temp 97.3°F | Resp 18 | Ht 65.0 in | Wt 118.4 lb

## 2014-03-14 DIAGNOSIS — Z853 Personal history of malignant neoplasm of breast: Secondary | ICD-10-CM

## 2014-03-14 DIAGNOSIS — C50911 Malignant neoplasm of unspecified site of right female breast: Secondary | ICD-10-CM

## 2014-03-14 LAB — CBC WITH DIFFERENTIAL/PLATELET
BASO%: 1.3 % (ref 0.0–2.0)
Basophils Absolute: 0.1 10*3/uL (ref 0.0–0.1)
EOS%: 2.6 % (ref 0.0–7.0)
Eosinophils Absolute: 0.2 10*3/uL (ref 0.0–0.5)
HCT: 36.8 % (ref 34.8–46.6)
HEMOGLOBIN: 12.4 g/dL (ref 11.6–15.9)
LYMPH%: 44.1 % (ref 14.0–49.7)
MCH: 31.3 pg (ref 25.1–34.0)
MCHC: 33.6 g/dL (ref 31.5–36.0)
MCV: 93 fL (ref 79.5–101.0)
MONO#: 0.5 10*3/uL (ref 0.1–0.9)
MONO%: 8.4 % (ref 0.0–14.0)
NEUT#: 2.6 10*3/uL (ref 1.5–6.5)
NEUT%: 43.6 % (ref 38.4–76.8)
Platelets: 306 10*3/uL (ref 145–400)
RBC: 3.95 10*6/uL (ref 3.70–5.45)
RDW: 14.1 % (ref 11.2–14.5)
WBC: 6 10*3/uL (ref 3.9–10.3)
lymph#: 2.6 10*3/uL (ref 0.9–3.3)

## 2014-03-14 LAB — COMPREHENSIVE METABOLIC PANEL (CC13)
ALBUMIN: 3.8 g/dL (ref 3.5–5.0)
ALK PHOS: 63 U/L (ref 40–150)
ALT: 7 U/L (ref 0–55)
AST: 14 U/L (ref 5–34)
Anion Gap: 9 mEq/L (ref 3–11)
BUN: 7.9 mg/dL (ref 7.0–26.0)
CALCIUM: 9.5 mg/dL (ref 8.4–10.4)
CHLORIDE: 106 meq/L (ref 98–109)
CO2: 22 mEq/L (ref 22–29)
Creatinine: 1.4 mg/dL — ABNORMAL HIGH (ref 0.6–1.1)
Glucose: 96 mg/dl (ref 70–140)
POTASSIUM: 4.2 meq/L (ref 3.5–5.1)
SODIUM: 136 meq/L (ref 136–145)
TOTAL PROTEIN: 7.2 g/dL (ref 6.4–8.3)
Total Bilirubin: 0.48 mg/dL (ref 0.20–1.20)

## 2014-03-14 NOTE — Progress Notes (Signed)
[place]  [phone] [phone]  OFFICE PROGRESS NOTE    ID: Sara Hines   DOB: Feb 20, 1935  MR#: [id number]  OBS#:[id number]  PCP: Deland Pretty, MD GYN: Bobbye Charleston, MD SU: Fanny Skates, MD  SU: Crissie Reese, MD RAD ONC: Arloa Koh, MD CARDIO:  Loralie Champagne, M.D.     HISTORY OF PRESENT ILLNESS: From Dr. Collier Salina Hines's 07/13/2010 note:  "This woman has had annual screening mammography.  She had a screening mammogram on 06/04/2010, calcifications were seen in the right breast.  A diagnostic view was recommended.  On physical exam at that time, she was noted to have hardness around the right areola with thickening.  Ultrasound showed no definitive right breast mass, but the right areola was difficult to evaluate.  MagView showed some calcifications in the upper right breast without associated mass. She underwent a biopsy of the right breast on 06/29/2010.  This showed invasive ductal cancer with lymphovascular invasion and neural invasion, this was ER positive 100%, PR positive 100%, proliferative index 38%, and HER-2 was amplified with a ratio of 2.5.  The patient did undergo an MRI scan of both breasts on 07/06/2010.  This showed a protruding 1.7 x 1.5 x 2 cm right subareolar mass, thickening in the half right nipple areolar complex.  No enlarged lymph nodes were seen.  No abnormal enhancements seen in the left breast."     Her  subsequent history is as detailed below.   INTERVAL HISTORY: Sara Hines returns today for followup of her right breast cancer. At the last visit here she had been taking exemestane very erratically, and since that time she has completely stopped it. She tells me it was making her legs hurt, which is certainly a possibility. She is "seeing a lot of doctors" and wonders if it is really necessary for her to be coming here since "everything is going so well".   REVIEW OF SYSTEMS: Aside from problems relating to her  irregular heartbeat, which are followed by Dr. Aundra Dubin, her only complaint is allergies. A detailed review of systems today was otherwise entirely negative.  PAST MEDICAL HISTORY: Past Medical History  Diagnosis Date  . Atrial fibrillation   . Hypothyroidism   . Unspecified essential hypertension   . Malignant neoplasm of breast (female), unspecified site     invasive duct, right breast  . Other and unspecified hyperlipidemia   . MVP (mitral valve prolapse)   . Bilateral cataracts   . Diverticulosis   . History of depression   . GERD (gastroesophageal reflux disease)   . Personal history of other disorder of urinary system   . Personal history of other disorders of nervous system and sense organs      PAST SURGICAL HISTORY: Past Surgical History  Procedure Laterality Date  . Left heart cath  1997  . Inguinal hernia repair  1997  . Total mastectomy  07/2010    right  . Breast reconstruction  10/2010  . Right ear surgery    . Shoulder surgery       FAMILY HISTORY Family History  Problem Relation Age of Onset  . Atrial fibrillation Father   . Dementia Mother   . Diabetes Sister   . Healthy Brother   . Healthy Sister   . Healthy Sister   . Healthy Brother   . Healthy Brother   . Healthy Brother   Patient's father died at the age of 47. Patient's mother died at the age of 35  following a stroke. The patient had 4 brothers, 3 sisters. There is no history of breast or ovarian cancer in the family to her knowledge.   GYNECOLOGIC HISTORY: Menarche age 39, first live birth age 17, she is G4 P4, went through the change of life approximately age 33. She took birth control pills remotely, with no complications.   SOCIAL HISTORY: The patient used to work in an assisted living setting, mostly doing cleaning. Her husband died from complications of congestive heart failure in 2010. [name] lives by herself. Her children are Sara Hines, who lives in [place] and works as a  Pharmacologist; Sara Hines who works for [employer] in [place]; Sara Hines, who works in an Conservation officer, historic buildings in [place]; and Sara Hines, who works in Engineer, mining and lives in [place]. The patient has 10 grandchildren and "a bunch" of great-grandchildren. She is a Psychologist, forensic.   ADVANCED DIRECTIVES: Not in place. The patient tells me she intends to name her sister Sara Hines as healthcare power of attorney.  [name] can be reached at [phone]. The patient was given the upper [place] documents to complete and notarized today.   HEALTH MAINTENANCE: History  Substance Use Topics  . Smoking status: Former Research scientist (life sciences)  . Smokeless tobacco: Never Used     Comment: quit 07/2010  . Alcohol Use: No     Colonoscopy: Not on file  PAP: Not on file  Bone density: The patient states she had a bone density scan that her primary care physician's office in 2014-specific  results are unknown to her.  Lipid panel: Not on file  Allergies  Allergen Reactions  . Aspirin     Upset stomach   . Darvocet A500 [Propoxyphene N-Acetaminophen] Nausea Only  . Flagyl [Metronidazole] Nausea Only  . Ibuprofen     Upset stomach   . Phenol-Yellow Jacket Venom Protein [Bee Venom]     Hyperactive node    Current Outpatient Prescriptions  Medication Sig Dispense Refill  . Calcium Carbonate-Vitamin D (CALCIUM-D) 600-400 MG-UNIT TABS Take 1 tablet by mouth daily.      . cetirizine (ZYRTEC) 10 MG tablet Take 10 mg by mouth daily.      Marland Kitchen diltiazem (CARDIZEM) 30 MG tablet Take 30 mg by mouth 2 (two) times daily.       . fish oil-omega-3 fatty acids 1000 MG capsule Take 1 g by mouth daily.      Marland Kitchen levothyroxine (SYNTHROID, LEVOTHROID) 88 MCG tablet Take 88 mcg by mouth daily before breakfast.       . losartan (COZAAR) 50 MG tablet Take 50 mg by mouth daily.        No current facility-administered medications for this visit.    OBJECTIVE: Middle-aged white woman in no acute distress  Filed Vitals:   03/14/14 1348    BP: 100/63  Pulse: 85  Temp: 97.3 F (36.3 C)  Resp: 18     Body mass index is 19.7 kg/(m^2).    ECOG FS: 0 - Asymptomatic  Sclerae unicteric, EOMs intact Oropharynx clear and moist thrush No cervical or supraclavicular adenopathy Lungs no rales or rhonchi Heart regular rate and rhythm Abd soft, nontender, positive bowel sounds MSK no focal spinal tenderness, no upper extremity lymphedema Neuro: nonfocal, well oriented, friendly affect Breasts: The right breast is status post mastectomy with implant reconstruction. There is no evidence of disease recurrence and specifically no irregularities or the breast and no skin changes. The right axilla is benign. The left breast is unremarkable  LAB RESULTS: Lab Results  Component Value Date   WBC 6.0 03/14/2014   NEUTROABS 2.6 03/14/2014   HGB 12.4 03/14/2014   HCT 36.8 03/14/2014   MCV 93.0 03/14/2014   PLT 306 03/14/2014      Chemistry      Component Value Date/Time   NA 139 06/12/2013 0948   NA 139 10/26/2010 1218   K 4.5 06/12/2013 0948   K 4.0 10/26/2010 1218   CL 100 07/06/2012 1554   CL 105 10/26/2010 1218   CO2 24 06/12/2013 0948   CO2 27 10/26/2010 1218   BUN 10.4 06/12/2013 0948   BUN 10 10/26/2010 1218   CREATININE 1.2* 06/12/2013 0948   CREATININE 0.89 10/26/2010 1218      Component Value Date/Time   CALCIUM 10.1 06/12/2013 0948   CALCIUM 9.6 10/26/2010 1218   ALKPHOS 70 06/12/2013 0948   ALKPHOS 57 07/22/2010 1234   AST 18 06/12/2013 0948   AST 23 07/22/2010 1234   ALT 7 06/12/2013 0948   ALT 12 07/22/2010 1234   BILITOT 0.57 06/12/2013 0948   BILITOT 0.4 07/22/2010 1234       Lab Results  Component Value Date   LABCA2 26 07/06/2012    No components found with this basename: [id number]    No results found for this basename: INR,  in the last 168 hours  Urinalysis    Component Value Date/Time   COLORURINE YELLOW 07/22/2010 [place] 07/22/2010 1232   LABSPEC 1.005 07/22/2010 1232   PHURINE 7.0  07/22/2010 [place] 07/22/2010 1232   HGBUR SMALL* 07/22/2010 [place] 07/22/2010 [place] 07/22/2010 1232   PROTEINUR NEGATIVE 07/22/2010 1232   UROBILINOGEN 0.2 07/22/2010 1232   NITRITE NEGATIVE 07/22/2010 1232   LEUKOCYTESUR NEGATIVE 07/22/2010 1232    STUDIES: Left mammography 01/14/2014 at [facility] was unremarkable  ASSESSMENT: 79 y.o. [place], [place] woman status post right mastectomy and sentinel lymph node sampling on 07/24/2010 for a pT1c pN1, stage IIA invasive ductal carcinoma, grade 2, estrogen and progesterone receptors both 100% positive, MIB-1 of 38%, and HER-2 amplified with a ratio by CISH of 2.50  (1) Oncotype score of 27 predicted a risk of distant recurrence of 18% if the patient's only systemic treatment was Tamoxifen for 5 years.  (2) The patient was advised she was at high risk for right chest wall and nodal recurrence given her skin and lymphovascular invasion involvement, as well as her 2 of 5 lymph nodes involved. I do not find evidence that she received post mastectomy radiation.  (3) Patient refused chemotherapy and anti-HER-2 treatment.  (4) Anastrozole started October 2013, stopped at some point by the patient who is not interested in resuming it.   PLAN:  Jailee is doing terrific as far as her breast cancer is concerned. We worry about her, of course, because she refuses standard care. Nevertheless there is no evidence of disease recurrence at this point.  I am not uncomfortable releasing her to her primary care physician at this point, since we are not intervening in her care. I have alerted her to let me know if any bumps or redness or other changes occur over her right breast or right axilla, which is the area where most concerned she might have a recurrence in. Of course I will be glad to see her at any point in the future if the need arises. At this point however we are making  no further  routine appointment for her here.  Faith Patricelli C 03/14/2014  2:11 PM

## 2014-03-15 ENCOUNTER — Telehealth: Payer: Self-pay | Admitting: Oncology

## 2014-03-15 NOTE — Telephone Encounter (Signed)
Per 07/08 POF lft msg for pt labs/ov 99yr f/u also mailed ltr w/schedule......KJ

## 2014-03-18 ENCOUNTER — Telehealth: Payer: Self-pay | Admitting: Oncology

## 2014-03-18 NOTE — Telephone Encounter (Signed)
Sent letter to [place] office from Dr. Jana Hakim.

## 2015-01-02 ENCOUNTER — Ambulatory Visit (INDEPENDENT_AMBULATORY_CARE_PROVIDER_SITE_OTHER): Payer: Medicare Other | Admitting: Cardiology

## 2015-01-02 ENCOUNTER — Encounter: Payer: Self-pay | Admitting: Cardiology

## 2015-01-02 VITALS — BP 124/70 | HR 71 | Ht 66.0 in | Wt 116.0 lb

## 2015-01-02 DIAGNOSIS — I482 Chronic atrial fibrillation, unspecified: Secondary | ICD-10-CM

## 2015-01-02 LAB — BASIC METABOLIC PANEL
BUN: 12 mg/dL (ref 6–23)
CHLORIDE: 102 meq/L (ref 96–112)
CO2: 27 mEq/L (ref 19–32)
Calcium: 10 mg/dL (ref 8.4–10.5)
Creatinine, Ser: 1.32 mg/dL — ABNORMAL HIGH (ref 0.40–1.20)
GFR: 41.43 mL/min — ABNORMAL LOW (ref >60.00)
GLUCOSE: 97 mg/dL (ref 70–99)
POTASSIUM: 4.1 meq/L (ref 3.5–5.1)
SODIUM: 135 meq/L (ref 135–145)

## 2015-01-02 NOTE — Progress Notes (Signed)
Patient ID: Sara Hines, female   DOB: [birth year], 80 y.o.   MRN: [id number] PCP: Dr. Shelia Media  80 yo with history of breast cancer s/p mastectomy and chronic atrial fibrillation returns for cardiology followup.  She has been in atrial fibrillation chronically for a number of years now.  She has refused anticoagulation and says that she "just can't take" aspirin.    In general, she has been doing well symptomatically.  She walks for exercise and denies exertional dyspnea.  She can walk up a flight of steps without problems.  No chest pain, no lightheadedness, no syncope.  No history of stroke-like symptoms.  Main complaint is seasonal allergies.   ECG: atrial fibrillation with rate 71, otherwise normal  Labs (7/15): K 4.2, creatinine 1.4, HCT 36.8  PMH: 1. Breast cancer: Right mastectomy in 2011, ER/PR+, 3/5 lymph nodes positive.  Refused chemo/radiation.  2. Hypothyroidism 3. Hyperlipidemia 4. Mitral valve prolapse 5. Chronic atrial fibrillation: Not on anticoagulation. Echo (4/14) with EF 60-65%, trivial MR, normal RV size and systolic function.  6. Chronic hematuria 7. Diverticulosis 8. Depression 9. GERD 10. LHC (Dr. Glade Lloyd) in 1997 was normal.   SH: Widow, lives with son.  Has 4 children.  Lives in [place].  Not smoking.    FH: Father with atrial fibrillation, lived into his 12s.  Mother with dementia.  Current Outpatient Prescriptions  Medication Sig Dispense Refill  . Calcium Carbonate-Vitamin D (CALCIUM-D) 600-400 MG-UNIT TABS Take 1 tablet by mouth daily.    . cetirizine (ZYRTEC) 10 MG tablet Take 10 mg by mouth daily.    Marland Kitchen diltiazem (CARDIZEM) 30 MG tablet Take 30 mg by mouth 2 (two) times daily.     . fish oil-omega-3 fatty acids 1000 MG capsule Take 1 g by mouth daily.    Marland Kitchen levothyroxine (SYNTHROID, LEVOTHROID) 88 MCG tablet Take 88 mcg by mouth daily before breakfast.     . losartan (COZAAR) 50 MG tablet Take 50 mg by mouth daily.     . pantoprazole (PROTONIX) 40  MG tablet Take 1 tablet by mouth daily. Pt takes 40 mg by mouth every morning with breakfast     No current facility-administered medications for this visit.    BP 124/70 mmHg  Pulse 71  Ht 5\' 6"  (1.676 m)  Wt 116 lb (52.617 kg)  BMI 18.73 kg/m2 General: NAD Neck: No JVD, no thyromegaly or thyroid nodule.  Lungs: Clear to auscultation bilaterally with normal respiratory effort. CV: Nondisplaced PMI.  Heart irregular S1/S2, no S3/S4, no murmur.  No peripheral edema.  No carotid bruit.  Normal pedal pulses.  Abdomen: Soft, nontender, no hepatosplenomegaly, no distention.  Skin: Intact without lesions or rashes.  Neurologic: Alert and oriented x 3.  Psych: Normal affect. Extremities: No clubbing or cyanosis.  HEENT: Normal.   Assessment/Plan: 1. Atrial fibrillation: Chronic.  She is minimally symptomatic.  EF preserved on echo in 4/14.  HR has been historically well-controlled on her current diltiazem regimen, so will not change this.  CHADSVASC = 3 for age and gender.  Ideally, she would be on anticoagulation.  I again recommended a NOAC. She does not want any form of anticoagulation at this time and understands her stroke risk.  She also says that she cannot take aspirin.  We talked about signs of stroke and she knows to call 911 immediately if they occur.  2. Breast cancer: Stable, has been released by oncology.  3. CKD: Creatinine mildly elevated in 7/16.  Given losartan  use, will repeat BMET today.   Followup in 1 year.   Loralie Champagne 01/02/2015

## 2015-01-02 NOTE — Patient Instructions (Addendum)
Medication Instructions:  No changes today.  Labwork: BMET today  Testing/Procedures: None today  Follow-Up: Your physician wants you to follow-up in: 1 year with Dr Aundra Dubin. (April 2017). You will receive a reminder letter in the mail two months in advance. If you don't receive a letter, please call our office to schedule the follow-up appointment.

## 2015-01-30 DIAGNOSIS — Z0000 Encounter for general adult medical examination without abnormal findings: Secondary | ICD-10-CM | POA: Diagnosis not present

## 2015-01-30 DIAGNOSIS — E780 Pure hypercholesterolemia: Secondary | ICD-10-CM | POA: Diagnosis not present

## 2015-01-30 DIAGNOSIS — E039 Hypothyroidism, unspecified: Secondary | ICD-10-CM | POA: Diagnosis not present

## 2015-01-30 DIAGNOSIS — K219 Gastro-esophageal reflux disease without esophagitis: Secondary | ICD-10-CM | POA: Diagnosis not present

## 2015-01-30 DIAGNOSIS — N390 Urinary tract infection, site not specified: Secondary | ICD-10-CM | POA: Diagnosis not present

## 2015-02-05 DIAGNOSIS — K219 Gastro-esophageal reflux disease without esophagitis: Secondary | ICD-10-CM | POA: Diagnosis not present

## 2015-02-05 DIAGNOSIS — I4891 Unspecified atrial fibrillation: Secondary | ICD-10-CM | POA: Diagnosis not present

## 2015-02-05 DIAGNOSIS — E780 Pure hypercholesterolemia: Secondary | ICD-10-CM | POA: Diagnosis not present

## 2015-02-05 DIAGNOSIS — Z0000 Encounter for general adult medical examination without abnormal findings: Secondary | ICD-10-CM | POA: Diagnosis not present

## 2015-02-10 DIAGNOSIS — Z1231 Encounter for screening mammogram for malignant neoplasm of breast: Secondary | ICD-10-CM | POA: Diagnosis not present

## 2015-03-04 NOTE — Addendum Note (Signed)
Addended by: Marlis Edelson C on: 03/04/2015 06:09 PM   Modules accepted: Orders

## 2015-03-14 ENCOUNTER — Other Ambulatory Visit: Payer: Self-pay | Admitting: *Deleted

## 2015-03-14 DIAGNOSIS — C50911 Malignant neoplasm of unspecified site of right female breast: Secondary | ICD-10-CM

## 2015-03-17 ENCOUNTER — Ambulatory Visit: Payer: Medicare Other | Admitting: Oncology

## 2015-03-17 ENCOUNTER — Other Ambulatory Visit: Payer: Medicare Other

## 2015-03-17 NOTE — Progress Notes (Signed)
No show

## 2015-04-21 DIAGNOSIS — J309 Allergic rhinitis, unspecified: Secondary | ICD-10-CM | POA: Diagnosis not present

## 2015-05-08 DIAGNOSIS — E039 Hypothyroidism, unspecified: Secondary | ICD-10-CM | POA: Diagnosis not present

## 2015-05-08 DIAGNOSIS — J309 Allergic rhinitis, unspecified: Secondary | ICD-10-CM | POA: Diagnosis not present

## 2015-05-08 DIAGNOSIS — J069 Acute upper respiratory infection, unspecified: Secondary | ICD-10-CM | POA: Diagnosis not present

## 2015-05-08 DIAGNOSIS — R05 Cough: Secondary | ICD-10-CM | POA: Diagnosis not present

## 2015-05-08 DIAGNOSIS — R5383 Other fatigue: Secondary | ICD-10-CM | POA: Diagnosis not present

## 2015-05-08 DIAGNOSIS — N390 Urinary tract infection, site not specified: Secondary | ICD-10-CM | POA: Diagnosis not present

## 2015-06-30 DIAGNOSIS — S61419A Laceration without foreign body of unspecified hand, initial encounter: Secondary | ICD-10-CM | POA: Diagnosis not present

## 2015-08-05 DIAGNOSIS — L814 Other melanin hyperpigmentation: Secondary | ICD-10-CM | POA: Diagnosis not present

## 2015-08-05 DIAGNOSIS — Z85828 Personal history of other malignant neoplasm of skin: Secondary | ICD-10-CM | POA: Diagnosis not present

## 2015-08-05 DIAGNOSIS — D1801 Hemangioma of skin and subcutaneous tissue: Secondary | ICD-10-CM | POA: Diagnosis not present

## 2015-10-27 DIAGNOSIS — E039 Hypothyroidism, unspecified: Secondary | ICD-10-CM | POA: Diagnosis not present

## 2015-10-27 DIAGNOSIS — I482 Chronic atrial fibrillation: Secondary | ICD-10-CM | POA: Diagnosis not present

## 2015-10-27 DIAGNOSIS — Z720 Tobacco use: Secondary | ICD-10-CM | POA: Diagnosis not present

## 2015-10-27 DIAGNOSIS — R5383 Other fatigue: Secondary | ICD-10-CM | POA: Diagnosis not present

## 2015-10-27 DIAGNOSIS — R6889 Other general symptoms and signs: Secondary | ICD-10-CM | POA: Diagnosis not present

## 2015-12-29 NOTE — Progress Notes (Signed)
Cardiology Office Note   Date:  12/30/2015   ID:  Sara Hines, DOB 1935-06-22, MRN [id number]  PCP:  Horatio Pel, MD  Cardiologist:  Dr. Aundra Dubin    Chief Complaint  Patient presents with  . Atrial Fibrillation    chronic      History of Present Illness: Sara Hines is a 81 y.o. female who presents for atrial fib, last seen a year ago with Dr. Aundra Dubin.  She has a history of breast cancer s/p mastectomy and chronic atrial fibrillation returns for cardiology followup. She has been in atrial fibrillation chronically for a number of years now. She has refused anticoagulation and says that she "just can't take" aspirin. She controls the rate with her cardizem.  She knows to keep HR between 50-100.   She denies chest pain and SOB.  No complaints today.  No further breast cancer.  Heart cath 1997 with normal cors.     Past Medical History  Diagnosis Date  . Atrial fibrillation ([place])   . Hypothyroidism   . Unspecified essential hypertension   . Malignant neoplasm of breast (female), unspecified site     invasive duct, right breast  . Other and unspecified hyperlipidemia   . MVP (mitral valve prolapse)   . Bilateral cataracts   . Diverticulosis   . History of depression   . GERD (gastroesophageal reflux disease)   . Personal history of other disorder of urinary system   . Personal history of other disorders of nervous system and sense organs     Past Surgical History  Procedure Laterality Date  . Left heart cath  1997  . Inguinal hernia repair  1997  . Total mastectomy  07/2010    right  . Breast reconstruction  10/2010  . Right ear surgery    . Shoulder surgery       Current Outpatient Prescriptions  Medication Sig Dispense Refill  . Calcium Carbonate-Vitamin D (CALCIUM-D) 600-400 MG-UNIT TABS Take 1 tablet by mouth daily.    . cetirizine (ZYRTEC) 10 MG tablet Take 10 mg by mouth daily.    Marland Kitchen diltiazem (CARDIZEM) 30 MG tablet Take 30 mg by mouth 2  (two) times daily.     . fish oil-omega-3 fatty acids 1000 MG capsule Take 1 g by mouth daily.    Marland Kitchen levothyroxine (SYNTHROID, LEVOTHROID) 88 MCG tablet Take 88 mcg by mouth daily before breakfast.     . losartan (COZAAR) 50 MG tablet Take 50 mg by mouth daily.     . pantoprazole (PROTONIX) 40 MG tablet Take 1 tablet by mouth daily. Pt takes 40 mg by mouth every morning with breakfast     No current facility-administered medications for this visit.    Allergies:   Aspirin; Darvocet a500; Flagyl; Ibuprofen; and Phenol-yellow jacket venom protein    Social History:  The patient  reports that she has quit smoking. She has never used smokeless tobacco. She reports that she does not drink alcohol or use illicit drugs.   Family History:  The patient's family history includes Atrial fibrillation in her father; Dementia in her mother; Diabetes in her sister; Healthy in her brother, brother, brother, brother, sister, and sister.    ROS:  General:no colds or fevers, no weight changes Skin:no rashes or ulcers HEENT:no blurred vision, no congestion CV:see HPI PUL:see HPI GI:no diarrhea constipation or melena, no indigestion GU:no hematuria, no dysuria MS:no joint pain, no claudication Neuro:no syncope, no lightheadedness Endo:no diabetes, + thyroid disease followed  by Dr. Drue Stager Readings from Last 3 Encounters:  12/30/15 115 lb 6.4 oz (52.345 kg)  01/02/15 116 lb (52.617 kg)  03/14/14 118 lb 6.4 oz (53.706 kg)     PHYSICAL EXAM: VS:  BP 126/68 mmHg  Pulse 80  Ht 5\' 6"  (1.676 m)  Wt 115 lb 6.4 oz (52.345 kg)  BMI 18.63 kg/m2 , BMI Body mass index is 18.63 kg/(m^2). General:Pleasant affect, NAD Skin:Warm and dry, brisk capillary refill HEENT:normocephalic, sclera clear, mucus membranes moist Neck:supple, no JVD, no bruits  Heart:irreg irreg  without murmur, gallup, rub or click Lungs:clear without rales, rhonchi, or wheezes [id number], non tender, + BS, do not palpate liver spleen or  masses Ext:no lower ext edema, 2+ pedal pulses, 2+ radial pulses Neuro:alert and oriented, MAE, follows commands, + facial symmetry    EKG:  EKG is ordered today. The ekg ordered today demonstrates a fib with rate control.  No changes from last year.   Recent Labs: 01/02/2015: BUN 12; Creatinine, Ser 1.32*; Potassium 4.1; Sodium 135    Lipid Panel No results found for: CHOL, TRIG, HDL, CHOLHDL, VLDL, LDLCALC, LDLDIRECT     Other studies Reviewed: Additional studies/ records that were reviewed today include: previous ntoes   ASSESSMENT AND PLAN:  1. Atrial fibrillation: Chronic. She is minimally symptomatic. EF preserved on echo in 4/14. HR has been historically well-controlled on her current diltiazem regimen, so will not change this. CHADSVASC = 3 for age and gender. Ideally, she would be on anticoagulation. Dr. Aundra Dubin has discussed with her and recommended a NOAC. She does not want any form of anticoagulation at this time and understands her stroke risk. I also reviewed with her. She also says that she cannot take aspirin. We talked about signs of stroke and she knows to call 911 immediately if they occur. She told me what she would do if any signs.  2. Breast cancer: Stable, has been released by oncology.  3. CKD: Creatinine mildly elevated in 7/16. Given losartan use, labs followed by Dr. Shelia Media and she is to have labs June 5th. She will ask him to send [place] a copy.   She will follow up with Dr. Aundra Dubin in 1 year or earlier if any problems.     Current medicines are reviewed with the patient today.  The patient Has no concerns regarding medicines.  The following changes have been made:  See above Labs/ tests ordered today include:see above  Disposition:   FU:  see above  Lennie Muckle, NP  12/30/2015 9:49 AM    [facility] [place], [place], [place] , [place] Phone: [phone]; Fax: [phone]

## 2015-12-30 ENCOUNTER — Encounter: Payer: Self-pay | Admitting: Cardiology

## 2015-12-30 ENCOUNTER — Ambulatory Visit (INDEPENDENT_AMBULATORY_CARE_PROVIDER_SITE_OTHER): Payer: Medicare Other | Admitting: Cardiology

## 2015-12-30 VITALS — BP 126/68 | HR 80 | Ht 66.0 in | Wt 115.4 lb

## 2015-12-30 DIAGNOSIS — I482 Chronic atrial fibrillation, unspecified: Secondary | ICD-10-CM

## 2015-12-30 DIAGNOSIS — Z853 Personal history of malignant neoplasm of breast: Secondary | ICD-10-CM

## 2015-12-30 DIAGNOSIS — N183 Chronic kidney disease, stage 3 (moderate): Secondary | ICD-10-CM | POA: Diagnosis not present

## 2015-12-30 NOTE — Patient Instructions (Signed)
Medication Instructions:  Your physician recommends that you continue on your current medications as directed. Please refer to the Current Medication list given to you today.   Labwork: None ordered  Testing/Procedures: None ordered  Follow-Up: Your physician wants you to follow-up in: [place] will receive a reminder letter in the mail two months in advance. If you don't receive a letter, please call our office to schedule the follow-up appointment.    Any Other Special Instructions Will Be Listed Below (If Applicable).    If you need a refill on your cardiac medications before your next appointment, please call your pharmacy.

## 2016-02-09 DIAGNOSIS — Z0000 Encounter for general adult medical examination without abnormal findings: Secondary | ICD-10-CM | POA: Diagnosis not present

## 2016-02-09 DIAGNOSIS — E7800 Pure hypercholesterolemia, unspecified: Secondary | ICD-10-CM | POA: Diagnosis not present

## 2016-02-18 DIAGNOSIS — Z1231 Encounter for screening mammogram for malignant neoplasm of breast: Secondary | ICD-10-CM | POA: Diagnosis not present

## 2016-02-18 DIAGNOSIS — Z853 Personal history of malignant neoplasm of breast: Secondary | ICD-10-CM | POA: Diagnosis not present

## 2016-02-24 ENCOUNTER — Encounter: Payer: Self-pay | Admitting: Cardiology

## 2016-02-24 DIAGNOSIS — I10 Essential (primary) hypertension: Secondary | ICD-10-CM | POA: Diagnosis not present

## 2016-02-24 DIAGNOSIS — K219 Gastro-esophageal reflux disease without esophagitis: Secondary | ICD-10-CM | POA: Diagnosis not present

## 2016-02-24 DIAGNOSIS — Z0000 Encounter for general adult medical examination without abnormal findings: Secondary | ICD-10-CM | POA: Diagnosis not present

## 2016-02-24 DIAGNOSIS — I4891 Unspecified atrial fibrillation: Secondary | ICD-10-CM | POA: Diagnosis not present

## 2016-03-12 DIAGNOSIS — T148 Other injury of unspecified body region: Secondary | ICD-10-CM | POA: Diagnosis not present

## 2016-04-12 DIAGNOSIS — E039 Hypothyroidism, unspecified: Secondary | ICD-10-CM | POA: Diagnosis not present

## 2016-08-25 DIAGNOSIS — E039 Hypothyroidism, unspecified: Secondary | ICD-10-CM | POA: Diagnosis not present

## 2016-08-25 DIAGNOSIS — I10 Essential (primary) hypertension: Secondary | ICD-10-CM | POA: Diagnosis not present

## 2016-08-25 DIAGNOSIS — K219 Gastro-esophageal reflux disease without esophagitis: Secondary | ICD-10-CM | POA: Diagnosis not present

## 2016-10-19 DIAGNOSIS — H6121 Impacted cerumen, right ear: Secondary | ICD-10-CM | POA: Diagnosis not present

## 2016-10-19 DIAGNOSIS — H7011 Chronic mastoiditis, right ear: Secondary | ICD-10-CM | POA: Diagnosis not present

## 2016-10-19 DIAGNOSIS — H7191 Unspecified cholesteatoma, right ear: Secondary | ICD-10-CM | POA: Diagnosis not present

## 2016-10-19 DIAGNOSIS — H6691 Otitis media, unspecified, right ear: Secondary | ICD-10-CM | POA: Diagnosis not present

## 2016-10-19 DIAGNOSIS — H9041 Sensorineural hearing loss, unilateral, right ear, with unrestricted hearing on the contralateral side: Secondary | ICD-10-CM | POA: Diagnosis not present

## 2016-10-26 DIAGNOSIS — H7011 Chronic mastoiditis, right ear: Secondary | ICD-10-CM | POA: Diagnosis not present

## 2016-10-26 DIAGNOSIS — H6061 Unspecified chronic otitis externa, right ear: Secondary | ICD-10-CM | POA: Diagnosis not present

## 2016-10-26 DIAGNOSIS — H6691 Otitis media, unspecified, right ear: Secondary | ICD-10-CM | POA: Diagnosis not present

## 2016-10-26 DIAGNOSIS — H7101 Cholesteatoma of attic, right ear: Secondary | ICD-10-CM | POA: Diagnosis not present

## 2016-11-02 DIAGNOSIS — H6121 Impacted cerumen, right ear: Secondary | ICD-10-CM | POA: Diagnosis not present

## 2016-11-02 DIAGNOSIS — H7011 Chronic mastoiditis, right ear: Secondary | ICD-10-CM | POA: Diagnosis not present

## 2016-11-02 DIAGNOSIS — H7101 Cholesteatoma of attic, right ear: Secondary | ICD-10-CM | POA: Diagnosis not present

## 2016-12-09 ENCOUNTER — Encounter: Payer: Self-pay | Admitting: Internal Medicine

## 2016-12-20 ENCOUNTER — Ambulatory Visit (INDEPENDENT_AMBULATORY_CARE_PROVIDER_SITE_OTHER): Payer: Medicare Other | Admitting: Internal Medicine

## 2016-12-20 ENCOUNTER — Encounter: Payer: Self-pay | Admitting: Internal Medicine

## 2016-12-20 ENCOUNTER — Encounter (INDEPENDENT_AMBULATORY_CARE_PROVIDER_SITE_OTHER): Payer: Self-pay

## 2016-12-20 VITALS — BP 132/78 | HR 86 | Ht 66.0 in | Wt 111.4 lb

## 2016-12-20 DIAGNOSIS — E785 Hyperlipidemia, unspecified: Secondary | ICD-10-CM | POA: Diagnosis not present

## 2016-12-20 DIAGNOSIS — I4821 Permanent atrial fibrillation: Secondary | ICD-10-CM

## 2016-12-20 DIAGNOSIS — I482 Chronic atrial fibrillation: Secondary | ICD-10-CM

## 2016-12-20 DIAGNOSIS — I10 Essential (primary) hypertension: Secondary | ICD-10-CM | POA: Diagnosis not present

## 2016-12-20 NOTE — Patient Instructions (Signed)
Medication Instructions:  Your physician recommends that you continue on your current medications as directed. Please refer to the Current Medication list given to you today.    Labwork: None   Testing/Procedures: None   Follow-Up: Your physician wants you to follow-up in: 1 year with Dr End. (April 2019). You will receive a reminder letter in the mail two months in advance. If you don't receive a letter, please call our office to schedule the follow-up appointment.        If you need a refill on your cardiac medications before your next appointment, please call your pharmacy.

## 2016-12-20 NOTE — Progress Notes (Signed)
Follow-up Outpatient Visit Date: 12/20/2016  Primary Care Provider: Horatio Pel, MD Sara Hines [place] [address]  Chief Complaint: Follow-up atrial fibrillation  HPI:  Sara Hines is a 82 y.o. year-old female with history of permanent atrial fibrillation, hypertension, hyperlipidemia, and hypothyroidism, who presents for follow-up of atrial fibrillation. She was previously followed in our office by Dr. Aundra Hines and was last seen by Sara Hines in 12/2015. Since that time, Sara Hines has felt well. She denies chest pain, palpitations, lightheadedness, shortness of breath, orthopnea, PND, and edema. She takes her blood pressure medication (losartan) only intermittently out of concern for relatively low blood pressures at home (SBP 100-110). She has not had any symptoms of hypotension, including presyncope or generalized fatigue. She complains of bruising easily despite not being on anticoagulation or antiplatelet therapy.  --------------------------------------------------------------------------------------------------  Cardiovascular History & Procedures: Cardiovascular Problems:  Permanent atrial fibrillation  Risk Factors:  Hypertension, hyperlipidemia, and age > 23  Cath/PCI:  LHC (1997): Normal  CV Surgery:  None  EP Procedures and Devices:  None  Non-Invasive Evaluation(s):  TTE (01/03/13): Normal LV size and wall thickness with EF of 60-65%. No regional wall motion abnormalities. Normal RV size and function. No significant valvular abnormalities.  Recent CV Pertinent Labs: Lab Results  Component Value Date   K 4.1 01/02/2015   K 4.2 03/14/2014   BUN 12 01/02/2015   BUN 7.9 03/14/2014   CREATININE 1.32 (H) 01/02/2015   CREATININE 1.4 (H) 03/14/2014    Past medical and surgical history were reviewed and updated in EPIC.  Outpatient Encounter Prescriptions as of 12/20/2016  Medication Sig  . Calcium Carbonate-Vitamin D (CALCIUM-D)  600-400 MG-UNIT TABS Take 1 tablet by mouth daily.  . cetirizine (ZYRTEC) 10 MG tablet Take 10 mg by mouth daily.  Marland Kitchen diltiazem (CARDIZEM) 30 MG tablet Take 30 mg by mouth 2 (two) times daily.   . fish oil-omega-3 fatty acids 1000 MG capsule Take 1 g by mouth daily.  Marland Kitchen levothyroxine (SYNTHROID, LEVOTHROID) 88 MCG tablet Take 88 mcg by mouth daily before breakfast.   . losartan (COZAAR) 50 MG tablet Take 50 mg by mouth daily.   . pantoprazole (PROTONIX) 40 MG tablet Take 1 tablet by mouth daily. Pt takes 40 mg by mouth every morning with breakfast   No facility-administered encounter medications on file as of 12/20/2016.     Allergies: Aspirin; Darvocet a500 [propoxyphene n-acetaminophen]; Flagyl [metronidazole]; Ibuprofen; and Phenol-yellow jacket venom protein [bee venom]  Social History   Social History  . Marital status: Widowed    Spouse name: N/A  . Number of children: 4  . Years of education: N/A   Occupational History  . Not on file.   Social History Main Topics  . Smoking status: Current Some Day Smoker  . Smokeless tobacco: Never Used     Comment: quit 07/2010  . Alcohol use No  . Drug use: No  . Sexual activity: No   Other Topics Concern  . Not on file   Social History Narrative  . No narrative on file    Family History  Problem Relation Age of Onset  . Atrial fibrillation Father   . Dementia Mother   . Diabetes Sister   . Healthy Brother   . Healthy Sister   . Healthy Sister   . Healthy Brother   . Healthy Brother   . Healthy Brother     Review of Systems: A 12-system review of systems was performed and was  negative except as noted in the HPI.  --------------------------------------------------------------------------------------------------  Physical Exam: BP 132/78   Pulse 86   Ht 5\' 6"  (1.676 m)   Wt 111 lb 6.4 oz (50.5 kg)   BMI 17.98 kg/m   General:  Thin, elderly woman, seated comfortably in the exam room. HEENT: No conjunctival pallor  or scleral icterus.  Moist mucous membranes.  OP clear. Neck: Supple without lymphadenopathy, thyromegaly, JVD, or HJR.  No carotid bruit. Lungs: Normal work of breathing.  Clear to auscultation bilaterally without wheezes or crackles. Heart: Irregularly irregular rhythm without murmurs or rubs.  Non-displaced PMI. Abd: Bowel sounds present.  Soft, NT/ND without hepatosplenomegaly Ext: No lower extremity edema.  Radial, PT, and DP pulses are 2+ bilaterally. Skin: warm and dry without rash  EKG:  Atrial fibrillation; ventricular rate 86 bpm.  Lab Results  Component Value Date   WBC 6.0 03/14/2014   HGB 12.4 03/14/2014   HCT 36.8 03/14/2014   MCV 93.0 03/14/2014   PLT 306 03/14/2014    Lab Results  Component Value Date   NA 135 01/02/2015   K 4.1 01/02/2015   CL 102 01/02/2015   CO2 27 01/02/2015   BUN 12 01/02/2015   CREATININE 1.32 (H) 01/02/2015   GLUCOSE 97 01/02/2015   ALT 7 03/14/2014    No results found for: CHOL, HDL, LDLCALC, LDLDIRECT, TRIG, CHOLHDL   --------------------------------------------------------------------------------------------------  ASSESSMENT AND PLAN: Permanent atrial fibrillation The patient is adequately rate controlled today. She does not have any worrisome symptoms, including chest pain, shortness of breath, lightheadedness, and edema. She remains off anticoagulation and antiplatelet therapy. I spoke with her at length regarding the benefits of anticoagulation, particularly a novel oral anticoagulant, given her CHADSVASC score of 4. She continues to refuse all pharmacotherapy. We will continue her current medications.  Hypertension Blood pressure well controlled today. I encouraged her to take losartan on a regular basis, as prescribed by Dr. Shelia Hines. She should follow-up with him for ongoing management of her blood pressure and routine lab assessment.  Hyperlipidemia Sara Hines is not currently on pharmacotherapy. I will defer further evaluation  and management to Dr. Shelia Hines.  Follow-up: Return to clinic in 1 year.  Sara Bush, MD 12/20/2016 5:36 PM

## 2016-12-22 ENCOUNTER — Emergency Department (HOSPITAL_COMMUNITY)
Admission: EM | Admit: 2016-12-22 | Discharge: 2016-12-22 | Disposition: A | Discharge status: Home / Self Care | Payer: Medicare Other | Attending: Emergency Medicine | Admitting: Emergency Medicine

## 2016-12-22 ENCOUNTER — Emergency Department (HOSPITAL_COMMUNITY): Payer: Medicare Other

## 2016-12-22 ENCOUNTER — Encounter (HOSPITAL_COMMUNITY): Payer: Self-pay | Admitting: *Deleted

## 2016-12-22 DIAGNOSIS — Z853 Personal history of malignant neoplasm of breast: Secondary | ICD-10-CM | POA: Diagnosis not present

## 2016-12-22 DIAGNOSIS — Z79899 Other long term (current) drug therapy: Secondary | ICD-10-CM | POA: Diagnosis not present

## 2016-12-22 DIAGNOSIS — R0602 Shortness of breath: Secondary | ICD-10-CM | POA: Diagnosis not present

## 2016-12-22 DIAGNOSIS — E039 Hypothyroidism, unspecified: Secondary | ICD-10-CM | POA: Diagnosis not present

## 2016-12-22 DIAGNOSIS — K529 Noninfective gastroenteritis and colitis, unspecified: Secondary | ICD-10-CM | POA: Insufficient documentation

## 2016-12-22 DIAGNOSIS — I10 Essential (primary) hypertension: Secondary | ICD-10-CM | POA: Diagnosis not present

## 2016-12-22 DIAGNOSIS — R109 Unspecified abdominal pain: Secondary | ICD-10-CM | POA: Diagnosis not present

## 2016-12-22 DIAGNOSIS — F17200 Nicotine dependence, unspecified, uncomplicated: Secondary | ICD-10-CM | POA: Diagnosis not present

## 2016-12-22 LAB — COMPREHENSIVE METABOLIC PANEL
ALT: 9 U/L — AB (ref 14–54)
AST: 26 U/L (ref 15–41)
Albumin: 4 g/dL (ref 3.5–5.0)
Alkaline Phosphatase: 71 U/L (ref 38–126)
Anion gap: 11 (ref 5–15)
BUN: 13 mg/dL (ref 6–20)
CO2: 23 mmol/L (ref 22–32)
CREATININE: 1.1 mg/dL — AB (ref 0.44–1.00)
Calcium: 9.6 mg/dL (ref 8.9–10.3)
Chloride: 99 mmol/L — ABNORMAL LOW (ref 101–111)
GFR, EST AFRICAN AMERICAN: 54 mL/min — AB (ref >60)
GFR, EST NON AFRICAN AMERICAN: 46 mL/min — AB (ref >60)
Glucose, Bld: 81 mg/dL (ref 65–99)
Potassium: 3.7 mmol/L (ref 3.5–5.1)
SODIUM: 133 mmol/L — AB (ref 135–145)
Total Bilirubin: 0.4 mg/dL (ref 0.3–1.2)
Total Protein: 7.1 g/dL (ref 6.5–8.1)

## 2016-12-22 LAB — CBC
HEMATOCRIT: 33.8 % — AB (ref 36.0–46.0)
Hemoglobin: 11.2 g/dL — ABNORMAL LOW (ref 12.0–15.0)
MCH: 29.7 pg (ref 26.0–34.0)
MCHC: 33.1 g/dL (ref 30.0–36.0)
MCV: 89.7 fL (ref 78.0–100.0)
Platelets: 356 10*3/uL (ref 150–400)
RBC: 3.77 MIL/uL — ABNORMAL LOW (ref 3.87–5.11)
RDW: 14.4 % (ref 11.5–15.5)
WBC: 7.6 10*3/uL (ref 4.0–10.5)

## 2016-12-22 LAB — URINALYSIS, ROUTINE W REFLEX MICROSCOPIC
BACTERIA UA: NONE SEEN
BILIRUBIN URINE: NEGATIVE
Glucose, UA: NEGATIVE mg/dL
Ketones, ur: NEGATIVE mg/dL
Leukocytes, UA: NEGATIVE
NITRITE: NEGATIVE
PH: 6 (ref 5.0–8.0)
Protein, ur: NEGATIVE mg/dL
SPECIFIC GRAVITY, URINE: 1.002 — AB (ref 1.005–1.030)
Squamous Epithelial / LPF: NONE SEEN
WBC, UA: NONE SEEN WBC/hpf (ref 0–5)

## 2016-12-22 LAB — I-STAT TROPONIN, ED: Troponin i, poc: 0 ng/mL (ref 0.00–0.08)

## 2016-12-22 MED ORDER — AMOXICILLIN-POT CLAVULANATE 875-125 MG PO TABS: 1.0000 | ORAL_TABLET | Freq: Two times a day (BID) | ORAL | 0 refills | Status: AC

## 2016-12-22 MED ORDER — AMOXICILLIN-POT CLAVULANATE 875-125 MG PO TABS
1.0000 | ORAL_TABLET | Freq: Once | ORAL | Status: AC
  Administered 2016-12-22: 1 via ORAL
  Filled 2016-12-22: qty 1

## 2016-12-22 MED ORDER — DILTIAZEM HCL 60 MG PO TABS
60.0000 mg | ORAL_TABLET | Freq: Once | ORAL | Status: AC
  Administered 2016-12-22: 60 mg via ORAL
  Filled 2016-12-22: qty 1

## 2016-12-22 NOTE — ED Triage Notes (Addendum)
PT states lower abdominal pain this am that radiates to back.  Also c/o acute onset sob - nothing makes it worse or better.  Sats of 99%,breathing, non-labored.  PT keeps mentioning how weak she is.

## 2016-12-22 NOTE — ED Notes (Signed)
Pt denies any pain at this time st's she just doesn't feel good.  Family at bedside

## 2016-12-22 NOTE — Discharge Instructions (Signed)
Your CT scan should be shared with your physician You will need a repeat ultrasound or CT scan of your abdomen in the future to monitor for development of an Aneurysm. ER for worsening pain, fever, vomiting  Augmentin twice daily for 10 days Tylenol for pain

## 2016-12-22 NOTE — ED Provider Notes (Signed)
[facility] DEPT Provider Note   CSN: [id number] Arrival date & time: 12/22/16  1736     History   Chief Complaint Chief Complaint  Patient presents with  . Shortness of Breath  . Abdominal Pain    HPI Sara Hines is a 82 y.o. female.  HPI  The patient is a 82 year old female, she presents to the hospital with a complaint of abdominal discomfort she reports that she has had some generalized weakness today as well, denies any chest pain but did have a short amount of shortness of breath which she states is totally resolved. She denies swelling of the legs, denies fevers or chills, has not been around anybody else has been sick. The symptoms are improved spontaneously, no sick contacts.  Past Medical History:  Diagnosis Date  . Atrial fibrillation ([place])   . Bilateral cataracts   . Diverticulosis   . GERD (gastroesophageal reflux disease)   . History of depression   . Hypothyroidism   . Malignant neoplasm of breast (female), unspecified site    invasive duct, right breast  . MVP (mitral valve prolapse)   . Other and unspecified hyperlipidemia   . Personal history of other disorder of urinary system   . Personal history of other disorders of nervous system and sense organs   . Unspecified essential hypertension     Patient Active Problem List   Diagnosis Date Noted  . Mitral valve prolapse 01/01/2013  . Breast cancer, right breast ([place]) 12/11/2012  . ATRIAL FIBRILLATION 07/23/2010    Past Surgical History:  Procedure Laterality Date  . BREAST RECONSTRUCTION  10/2010  . INGUINAL HERNIA REPAIR  1997  . LEFT HEART CATH  1997  . right ear surgery    . SHOULDER SURGERY    . TOTAL MASTECTOMY  07/2010   right    OB History    No data available       Home Medications    Prior to Admission medications   Medication Sig Start Date End Date Taking? Authorizing Provider  amoxicillin-clavulanate (AUGMENTIN) 875-125 MG tablet Take 1 tablet by mouth every 12  (twelve) hours. 12/22/16   Noemi Chapel, MD  Calcium Carbonate-Vitamin D (CALCIUM-D) 600-400 MG-UNIT TABS Take 1 tablet by mouth daily.    Historical Provider, MD  cetirizine (ZYRTEC) 10 MG tablet Take 10 mg by mouth daily.    Historical Provider, MD  diltiazem (CARDIZEM) 30 MG tablet Take 30 mg by mouth 2 (two) times daily.  10/31/12   Historical Provider, MD  fish oil-omega-3 fatty acids 1000 MG capsule Take 1 g by mouth daily.    Historical Provider, MD  levothyroxine (SYNTHROID, LEVOTHROID) 88 MCG tablet Take 88 mcg by mouth daily before breakfast.  12/27/12   Historical Provider, MD  losartan (COZAAR) 50 MG tablet Take 50 mg by mouth daily.  11/28/12   Historical Provider, MD  pantoprazole (PROTONIX) 40 MG tablet Take 1 tablet by mouth daily. Pt takes 40 mg by mouth every morning with breakfast 12/21/14   Historical Provider, MD    Family History Family History  Problem Relation Age of Onset  . Atrial fibrillation Father   . Dementia Mother   . Diabetes Sister   . Healthy Brother   . Healthy Sister   . Healthy Sister   . Healthy Brother   . Healthy Brother   . Healthy Brother     Social History Social History  Substance Use Topics  . Smoking status: Current Some Day Smoker  .  Smokeless tobacco: Never Used     Comment: quit 07/2010  . Alcohol use No     Allergies   Aspirin; Darvocet a500 [propoxyphene n-acetaminophen]; Flagyl [metronidazole]; Ibuprofen; and Phenol-yellow jacket venom protein [bee venom]   Review of Systems Review of Systems  All other systems reviewed and are negative.    Physical Exam Updated Vital Signs BP (!) 190/103 (BP Location: Right Arm)   Pulse 91   Temp 98 F (36.7 C) (Oral)   Resp (!) 21   SpO2 95%   Physical Exam  Constitutional: She appears well-developed and well-nourished. No distress.  HENT:  Head: Normocephalic and atraumatic.  Mouth/Throat: Oropharynx is clear and moist. No oropharyngeal exudate.  Eyes: Conjunctivae and EOM  are normal. Pupils are equal, round, and reactive to light. Right eye exhibits no discharge. Left eye exhibits no discharge. No scleral icterus.  Neck: Normal range of motion. Neck supple. No JVD present. No thyromegaly present.  Cardiovascular: Normal rate, normal heart sounds and intact distal pulses.  Exam reveals no gallop and no friction rub.   No murmur heard. Irregular rhythm  Pulmonary/Chest: Effort normal and breath sounds normal. No respiratory distress. She has no wheezes. She has no rales.  Abdominal: Soft. Bowel sounds are normal. She exhibits no distension and no mass. There is tenderness ( mild R sided ttp, mid abdomen).  Musculoskeletal: Normal range of motion. She exhibits no edema or tenderness.  Lymphadenopathy:    She has no cervical adenopathy.  Neurological: She is alert. Coordination normal.  Skin: Skin is warm and dry. No rash noted. No erythema.  Psychiatric: She has a normal mood and affect. Her behavior is normal.  Nursing note and vitals reviewed.    ED Treatments / Results  Labs (all labs ordered are listed, but only abnormal results are displayed) Labs Reviewed  CBC - Abnormal; Notable for the following:       Result Value   RBC 3.77 (*)    Hemoglobin 11.2 (*)    HCT 33.8 (*)    All other components within normal limits  COMPREHENSIVE METABOLIC PANEL - Abnormal; Notable for the following:    Sodium 133 (*)    Chloride 99 (*)    Creatinine, Ser 1.10 (*)    ALT 9 (*)    GFR calc non Af Amer 46 (*)    GFR calc Af Amer 54 (*)    All other components within normal limits  URINALYSIS, ROUTINE W REFLEX MICROSCOPIC - Abnormal; Notable for the following:    Color, Urine STRAW (*)    Specific Gravity, Urine 1.002 (*)    Hgb urine dipstick SMALL (*)    All other components within normal limits  I-STAT TROPOININ, ED    EKG  EKG Interpretation  Date/Time:  Wednesday December 22 2016 17:45:05 EDT Ventricular Rate:  76 PR Interval:    QRS Duration: 78 QT  Interval:  384 QTC Calculation: 432 R Axis:   84 Text Interpretation:  Atrial fibrillation Abnormal ECG No old tracing to compare Confirmed by Harrie Cazarez  MD, [place] ([id number]) on 12/22/2016 6:43:18 PM       Radiology Dg Chest 2 View  Result Date: 12/22/2016 CLINICAL DATA:  Acute onset of shortness of breath and lower abdominal pain, radiating to the back. Initial encounter. EXAM: CHEST  2 VIEW COMPARISON:  None. FINDINGS: The lungs are well-aerated and clear. There is no evidence of focal opacification, pleural effusion or pneumothorax. The heart is normal in size; the mediastinal  contour is within normal limits. No acute osseous abnormalities are seen. A right-sided breast implant is noted. IMPRESSION: No acute cardiopulmonary process seen. Electronically Signed   By: Garald Balding M.D.   On: 12/22/2016 18:26   Ct Renal Stone Study  Result Date: 12/22/2016 CLINICAL DATA:  Right flank and abdominal pain. EXAM: CT ABDOMEN AND PELVIS WITHOUT CONTRAST TECHNIQUE: Multidetector CT imaging of the abdomen and pelvis was performed following the standard protocol without IV contrast. COMPARISON:  None. FINDINGS: Lower chest: Breathing motion artifact. Right breast prosthesis is partially included. Small posterior Bochdalek hernia on the right. No pleural fluid. Hepatobiliary: No evidence of focal hepatic lesion allowing for lack contrast. Gallbladder is contracted, no calcified gallstone. No biliary dilatation. Pancreas: No ductal dilatation or inflammation. Spleen: Normal in size without focal abnormality. Adrenals/Urinary Tract: Low-density nodularity of the left adrenal gland likely adenoma. Right adrenal gland is normal. No hydronephrosis or perinephric edema. Multifocal scarring throughout both kidneys, most prominent involving the left upper pole. There are 2 punctate nonobstructing stones in the mid upper left kidney, no right urolithiasis. Both ureters are decompressed without stones along the course. Urinary  bladder is minimally distended. No bladder wall thickening. Stomach/Bowel: Stomach is physiologically distended. No small bowel inflammation, distention or wall thickening. Multifocal colonic diverticulosis involving the entire colon. No evidence of acute diverticular inflammation. Minimal pericolonic edema about the proximal ascending colon without definite associated wall thickening. The appendix is identified with moderate confidence and normal. Moderate diffuse stool burden throughout the colon, tortuous transverse colon. Vascular/Lymphatic: Diffuse atherosclerosis of the abdominal aorta and its branches. Ectasia of the infrarenal aorta measuring 2.5 cm. No evidence of adenopathy. Reproductive: Uterus is atrophic.  No adnexal mass. Other: Sequela of right inguinal hernia repair without recurrence. No free air, free fluid, or intra-abdominal fluid collection. Musculoskeletal: There are no acute or suspicious osseous abnormalities. Degenerative change at the pubic symphysis, both hips and throughout the spine. IMPRESSION: 1. Questionable pericolonic inflammation involving the ascending colon, without definitive wall thickening, etiology is uncertain but may be the cause of patient's right-sided abdominal pain. Low-grade colitis is considered. 2. No right renal stones or obstructive uropathy. Small nonobstructing stones in the left kidney. Multifocal renal scarring, left greater than right. 3. Diffuse colonic diverticulosis without diverticular inflammation. Right-sided inflammation does not appear secondary to diverticular disease, however there are scattered diverticula involving the ascending colon. 4. Abdominal aorta and branch atherosclerosis. Ectatic abdominal aorta at risk for aneurysm development, maximal dimension 2.5 cm. Recommend followup by ultrasound in 5 years. This recommendation follows ACR consensus guidelines: White Paper of the ACR Incidental Findings Committee II on Vascular Findings. J Am Coll  Radiol 2013; 10:789-794. Electronically Signed   By: Jeb Levering M.D.   On: 12/22/2016 21:17    Procedures Procedures (including critical care time)  Medications Ordered in ED Medications  amoxicillin-clavulanate (AUGMENTIN) 875-125 MG per tablet 1 tablet (not administered)  diltiazem (CARDIZEM) tablet 60 mg (60 mg Oral Given 12/22/16 2014)     Initial Impression / Assessment and Plan / ED Course  I have reviewed the triage vital signs and the nursing notes.  Pertinent labs & imaging results that were available during my care of the patient were reviewed by me and considered in my medical decision making (see chart for details).     Labs reviewed with the patient, CT scan also reviewed with the patient showing some signs of right-sided colitis or diverticulitis. No other acute findings to suggest a source for her  symptoms. She feels better, blood pressure is better, Augmentin has been given as well as a dose of Cardizem. She has medications for home. She expressed her understanding to the indications for return and also expressed her understanding to the need for follow-up. I've given her a paper copy of her results to share with her physician to ensure follow-up for the imaging that as needed in the future to follow her ectatic aorta.  Expressed understanding as did family in the room with her.  Final Clinical Impressions(s) / ED Diagnoses   Final diagnoses:  Colitis  Hypertension, unspecified type    New Prescriptions New Prescriptions   AMOXICILLIN-CLAVULANATE (AUGMENTIN) 875-125 MG TABLET    Take 1 tablet by mouth every 12 (twelve) hours.     Noemi Chapel, MD 12/22/16 [phone]

## 2016-12-24 ENCOUNTER — Ambulatory Visit: Payer: Medicare Other | Admitting: Internal Medicine

## 2017-02-28 DIAGNOSIS — E7800 Pure hypercholesterolemia, unspecified: Secondary | ICD-10-CM | POA: Diagnosis not present

## 2017-02-28 DIAGNOSIS — Z0000 Encounter for general adult medical examination without abnormal findings: Secondary | ICD-10-CM | POA: Diagnosis not present

## 2017-02-28 DIAGNOSIS — E039 Hypothyroidism, unspecified: Secondary | ICD-10-CM | POA: Diagnosis not present

## 2017-03-03 DIAGNOSIS — K219 Gastro-esophageal reflux disease without esophagitis: Secondary | ICD-10-CM | POA: Diagnosis not present

## 2017-03-03 DIAGNOSIS — Z0000 Encounter for general adult medical examination without abnormal findings: Secondary | ICD-10-CM | POA: Diagnosis not present

## 2017-03-03 DIAGNOSIS — I10 Essential (primary) hypertension: Secondary | ICD-10-CM | POA: Diagnosis not present

## 2017-03-03 DIAGNOSIS — E039 Hypothyroidism, unspecified: Secondary | ICD-10-CM | POA: Diagnosis not present

## 2017-09-09 DIAGNOSIS — E039 Hypothyroidism, unspecified: Secondary | ICD-10-CM | POA: Diagnosis not present

## 2017-09-09 DIAGNOSIS — I482 Chronic atrial fibrillation: Secondary | ICD-10-CM | POA: Diagnosis not present

## 2017-09-09 DIAGNOSIS — K219 Gastro-esophageal reflux disease without esophagitis: Secondary | ICD-10-CM | POA: Diagnosis not present

## 2017-09-09 DIAGNOSIS — N183 Chronic kidney disease, stage 3 (moderate): Secondary | ICD-10-CM | POA: Diagnosis not present

## 2017-09-09 DIAGNOSIS — I10 Essential (primary) hypertension: Secondary | ICD-10-CM | POA: Diagnosis not present

## 2017-11-10 ENCOUNTER — Emergency Department (HOSPITAL_COMMUNITY)
Admission: EM | Admit: 2017-11-10 | Discharge: 2017-11-10 | Disposition: A | Discharge status: Home / Self Care | Payer: Medicare Other | Attending: Emergency Medicine | Admitting: Emergency Medicine

## 2017-11-10 ENCOUNTER — Encounter (HOSPITAL_COMMUNITY): Payer: Self-pay | Admitting: Emergency Medicine

## 2017-11-10 DIAGNOSIS — Z79899 Other long term (current) drug therapy: Secondary | ICD-10-CM | POA: Insufficient documentation

## 2017-11-10 DIAGNOSIS — Z853 Personal history of malignant neoplasm of breast: Secondary | ICD-10-CM | POA: Insufficient documentation

## 2017-11-10 DIAGNOSIS — E039 Hypothyroidism, unspecified: Secondary | ICD-10-CM | POA: Diagnosis not present

## 2017-11-10 DIAGNOSIS — I10 Essential (primary) hypertension: Secondary | ICD-10-CM | POA: Insufficient documentation

## 2017-11-10 DIAGNOSIS — K6289 Other specified diseases of anus and rectum: Secondary | ICD-10-CM | POA: Insufficient documentation

## 2017-11-10 DIAGNOSIS — F17200 Nicotine dependence, unspecified, uncomplicated: Secondary | ICD-10-CM | POA: Diagnosis not present

## 2017-11-10 DIAGNOSIS — K625 Hemorrhage of anus and rectum: Secondary | ICD-10-CM | POA: Diagnosis not present

## 2017-11-10 LAB — COMPREHENSIVE METABOLIC PANEL
ALBUMIN: 4.1 g/dL (ref 3.5–5.0)
ALT: 11 U/L — ABNORMAL LOW (ref 14–54)
ANION GAP: 7 (ref 5–15)
AST: 24 U/L (ref 15–41)
Alkaline Phosphatase: 69 U/L (ref 38–126)
BUN: 16 mg/dL (ref 6–20)
CALCIUM: 9.5 mg/dL (ref 8.9–10.3)
CHLORIDE: 104 mmol/L (ref 101–111)
CO2: 25 mmol/L (ref 22–32)
Creatinine, Ser: 1.24 mg/dL — ABNORMAL HIGH (ref 0.44–1.00)
GFR calc non Af Amer: 40 mL/min — ABNORMAL LOW (ref >60)
GFR, EST AFRICAN AMERICAN: 46 mL/min — AB (ref >60)
GLUCOSE: 115 mg/dL — AB (ref 65–99)
Potassium: 4.1 mmol/L (ref 3.5–5.1)
SODIUM: 136 mmol/L (ref 135–145)
Total Bilirubin: 0.2 mg/dL — ABNORMAL LOW (ref 0.3–1.2)
Total Protein: 7.5 g/dL (ref 6.5–8.1)

## 2017-11-10 LAB — CBC
HCT: 36.1 % (ref 36.0–46.0)
HEMOGLOBIN: 12.1 g/dL (ref 12.0–15.0)
MCH: 32.6 pg (ref 26.0–34.0)
MCHC: 33.5 g/dL (ref 30.0–36.0)
MCV: 97.3 fL (ref 78.0–100.0)
Platelets: 305 10*3/uL (ref 150–400)
RBC: 3.71 MIL/uL — AB (ref 3.87–5.11)
RDW: 13.5 % (ref 11.5–15.5)
WBC: 7.5 10*3/uL (ref 4.0–10.5)

## 2017-11-10 LAB — TYPE AND SCREEN
ABO/RH(D): O NEG
Antibody Screen: NEGATIVE

## 2017-11-10 LAB — ABO/RH: ABO/RH(D): O NEG

## 2017-11-10 MED ORDER — DOCUSATE SODIUM 100 MG PO CAPS: 100.0000 mg | ORAL_CAPSULE | Freq: Two times a day (BID) | ORAL | 0 refills | Status: DC

## 2017-11-10 NOTE — Discharge Instructions (Signed)
Follow-up with your primary care doctor to discuss a possible gastroenterology referral, continue doing the warm soaks as you have been doing.  Continue taking fiber and add the colace.  Return as needed for worsening symptoms

## 2017-11-10 NOTE — ED Triage Notes (Signed)
Patient reports that since last week when she has a BM she has bright red blood when she wipes. Reports that she has been weak and PCP sent her to ED.

## 2017-11-10 NOTE — ED Provider Notes (Addendum)
[facility] DEPT Provider Note   CSN: [id number] Arrival date & time: 11/10/17  1507     History   Chief Complaint Chief Complaint  Patient presents with  . Rectal Bleeding    HPI Sara Hines is a 83 y.o. female.  HPI Pt noticed about two weeks ago she noticed some rectal bleeding after a bowel movement.  She notices the blood once a day when she has a bowel movement. She has noticed occasional pain in the rectal area.  No fevers.  No vomiting.  She called her doctor today and was told to come to the ED to get checked.  Past Medical History:  Diagnosis Date  . Atrial fibrillation ([place])   . Bilateral cataracts   . Diverticulosis   . GERD (gastroesophageal reflux disease)   . History of depression   . Hypothyroidism   . Malignant neoplasm of breast (female), unspecified site    invasive duct, right breast  . MVP (mitral valve prolapse)   . Other and unspecified hyperlipidemia   . Personal history of other disorder of urinary system   . Personal history of other disorders of nervous system and sense organs   . Unspecified essential hypertension     Patient Active Problem List   Diagnosis Date Noted  . Mitral valve prolapse 01/01/2013  . Breast cancer, right breast ([place]) 12/11/2012  . ATRIAL FIBRILLATION 07/23/2010    Past Surgical History:  Procedure Laterality Date  . BREAST RECONSTRUCTION  10/2010  . INGUINAL HERNIA REPAIR  1997  . LEFT HEART CATH  1997  . right ear surgery    . SHOULDER SURGERY    . TOTAL MASTECTOMY  07/2010   right    OB History    No data available       Home Medications    Prior to Admission medications   Medication Sig Start Date End Date Taking? Authorizing Provider  amoxicillin-clavulanate (AUGMENTIN) 875-125 MG tablet Take 1 tablet by mouth every 12 (twelve) hours. 12/22/16   Noemi Chapel, MD  Calcium Carbonate-Vitamin D (CALCIUM-D) 600-400 MG-UNIT TABS Take 1 tablet by mouth daily.     [provider]  cetirizine (ZYRTEC) 10 MG tablet Take 10 mg by mouth daily.    [provider]  diltiazem (CARDIZEM) 30 MG tablet Take 30 mg by mouth 2 (two) times daily.  10/31/12   [provider]  docusate sodium (COLACE) 100 MG capsule Take 1 capsule (100 mg total) by mouth every 12 (twelve) hours. 11/10/17   Dorie Rank, MD  fish oil-omega-3 fatty acids 1000 MG capsule Take 1 g by mouth daily.    [provider]  levothyroxine (SYNTHROID, LEVOTHROID) 88 MCG tablet Take 88 mcg by mouth daily before breakfast.  12/27/12   [provider]  losartan (COZAAR) 50 MG tablet Take 50 mg by mouth daily.  11/28/12   [provider]  pantoprazole (PROTONIX) 40 MG tablet Take 1 tablet by mouth daily. Pt takes 40 mg by mouth every morning with breakfast 12/21/14   [provider]    Family History Family History  Problem Relation Age of Onset  . Atrial fibrillation Father   . Dementia Mother   . Diabetes Sister   . Healthy Brother   . Healthy Sister   . Healthy Sister   . Healthy Brother   . Healthy Brother   . Healthy Brother     Social History Social History   Tobacco Use  .  Smoking status: Current Some Day Smoker  . Smokeless tobacco: Never Used  . Tobacco comment: quit 07/2010  Substance Use Topics  . Alcohol use: No  . Drug use: No     Allergies   Aspirin; Darvocet a500 [propoxyphene n-acetaminophen]; Flagyl [metronidazole]; Ibuprofen; and Phenol-yellow jacket venom protein [bee venom]   Review of Systems Review of Systems  All other systems reviewed and are negative.    Physical Exam Updated Vital Signs BP (!) 157/88 (BP Location: Left Arm) Comment: Simultaneous filing. User may not have seen previous data.  Pulse 82   Temp 97.6 F (36.4 C) (Oral)   Resp 17   SpO2 100%   Physical Exam  Constitutional: She appears well-developed and well-nourished. No distress.  HENT:  Head: Normocephalic and atraumatic.    Right Ear: External ear normal.  Left Ear: External ear normal.  Eyes: Conjunctivae are normal. Right eye exhibits no discharge. Left eye exhibits no discharge. No scleral icterus.  Neck: Neck supple. No tracheal deviation present.  Cardiovascular: Normal rate, regular rhythm and intact distal pulses.  Pulmonary/Chest: Effort normal and breath sounds normal. No stridor. No respiratory distress. She has no wheezes. She has no rales.  Abdominal: Soft. Bowel sounds are normal. She exhibits no distension. There is no tenderness. There is no rebound and no guarding.  Genitourinary:  Genitourinary Comments: No gross blood on rectal exam, no mass palpated on digital rectal exam, questionable small anal fissure , guiac negative  Musculoskeletal: She exhibits no edema or tenderness.  Neurological: She is alert. She has normal strength. No cranial nerve deficit (no facial droop, extraocular movements intact, no slurred speech) or sensory deficit. She exhibits normal muscle tone. She displays no seizure activity. Coordination normal.  Skin: Skin is warm and dry. No rash noted.  Psychiatric: She has a normal mood and affect.  Nursing note and vitals reviewed.    ED Treatments / Results  Labs (all labs ordered are listed, but only abnormal results are displayed) Labs Reviewed  COMPREHENSIVE METABOLIC PANEL - Abnormal; Notable for the following components:      Result Value   Glucose, Bld 115 (*)    Creatinine, Ser 1.24 (*)    ALT 11 (*)    Total Bilirubin 0.2 (*)    GFR calc non Af Amer 40 (*)    GFR calc Af Amer 46 (*)    All other components within normal limits  CBC - Abnormal; Notable for the following components:   RBC 3.71 (*)    All other components within normal limits  POC OCCULT BLOOD, ED  TYPE AND SCREEN  ABO/RH     Procedures Procedures (including critical care time)  Medications Ordered in ED Medications - No data to display   Initial Impression / Assessment and Plan /  ED Course  I have reviewed the triage vital signs and the nursing notes.  Pertinent labs & imaging results that were available during my care of the patient were reviewed by me and considered in my medical decision making (see chart for details).   Patient presented to the emergency room for evaluation of rectal bleeding.  The symptoms have been going on for about a week or so whenever she has a bowel movement.  Patient has not noticed any episodes of just blood.  ED evaluation is reassuring.  No blood noted on rectal exam.  May have a small fissure.  Patient's laboratory tests are normal.  I think the patient is stable to follow-up  with her primary care doctor and consider outpatient GI follow-up.  Warning signs and precautions were discussed.  Final Clinical Impressions(s) / ED Diagnoses   Final diagnoses:  Rectal bleeding    ED Discharge Orders        Ordered    docusate sodium (COLACE) 100 MG capsule  Every 12 hours     11/10/17 2144           Dorie Rank, MD 11/10/17 2148

## 2017-11-18 ENCOUNTER — Encounter (HOSPITAL_COMMUNITY): Payer: Self-pay | Admitting: *Deleted

## 2017-11-18 ENCOUNTER — Emergency Department (HOSPITAL_COMMUNITY): Payer: Medicare Other

## 2017-11-18 ENCOUNTER — Other Ambulatory Visit: Payer: Self-pay

## 2017-11-18 ENCOUNTER — Emergency Department (HOSPITAL_COMMUNITY)
Admission: EM | Admit: 2017-11-18 | Discharge: 2017-11-18 | Disposition: A | Discharge status: Home / Self Care | Payer: Medicare Other | Attending: Emergency Medicine | Admitting: Emergency Medicine

## 2017-11-18 DIAGNOSIS — Z79899 Other long term (current) drug therapy: Secondary | ICD-10-CM | POA: Insufficient documentation

## 2017-11-18 DIAGNOSIS — R531 Weakness: Secondary | ICD-10-CM | POA: Insufficient documentation

## 2017-11-18 DIAGNOSIS — Z853 Personal history of malignant neoplasm of breast: Secondary | ICD-10-CM | POA: Diagnosis not present

## 2017-11-18 DIAGNOSIS — K5900 Constipation, unspecified: Secondary | ICD-10-CM | POA: Diagnosis not present

## 2017-11-18 DIAGNOSIS — E039 Hypothyroidism, unspecified: Secondary | ICD-10-CM | POA: Insufficient documentation

## 2017-11-18 DIAGNOSIS — R404 Transient alteration of awareness: Secondary | ICD-10-CM | POA: Diagnosis not present

## 2017-11-18 DIAGNOSIS — R42 Dizziness and giddiness: Secondary | ICD-10-CM | POA: Diagnosis not present

## 2017-11-18 LAB — BASIC METABOLIC PANEL
ANION GAP: 9 (ref 5–15)
BUN: 12 mg/dL (ref 6–20)
CHLORIDE: 106 mmol/L (ref 101–111)
CO2: 22 mmol/L (ref 22–32)
Calcium: 9.4 mg/dL (ref 8.9–10.3)
Creatinine, Ser: 1.21 mg/dL — ABNORMAL HIGH (ref 0.44–1.00)
GFR calc non Af Amer: 41 mL/min — ABNORMAL LOW (ref >60)
GFR, EST AFRICAN AMERICAN: 48 mL/min — AB (ref >60)
Glucose, Bld: 109 mg/dL — ABNORMAL HIGH (ref 65–99)
POTASSIUM: 4.4 mmol/L (ref 3.5–5.1)
SODIUM: 137 mmol/L (ref 135–145)

## 2017-11-18 LAB — CBC
HEMATOCRIT: 33.7 % — AB (ref 36.0–46.0)
Hemoglobin: 11.4 g/dL — ABNORMAL LOW (ref 12.0–15.0)
MCH: 32.6 pg (ref 26.0–34.0)
MCHC: 33.8 g/dL (ref 30.0–36.0)
MCV: 96.3 fL (ref 78.0–100.0)
Platelets: 271 10*3/uL (ref 150–400)
RBC: 3.5 MIL/uL — AB (ref 3.87–5.11)
RDW: 13.8 % (ref 11.5–15.5)
WBC: 5.8 10*3/uL (ref 4.0–10.5)

## 2017-11-18 NOTE — ED Notes (Signed)
Patient is attempting to get urine at present.

## 2017-11-18 NOTE — ED Notes (Signed)
Patient aware she needs a urine spec.

## 2017-11-18 NOTE — Discharge Instructions (Signed)
Do not take additional fiber until having more regular bowel movements. Continue stool softener. MiraLAX per day until having bowel movements. Colonoscopy as scheduled.

## 2017-11-18 NOTE — ED Triage Notes (Signed)
C/o weak and dizzy c/o nausea no vomiting. States she is having problems having BM last one 2 days ago . States she is scheduled to have colonoscopy the last of the month. Patient is c/o sob however speaking in complete sentences without stopping

## 2017-11-19 NOTE — ED Provider Notes (Addendum)
[facility] EMERGENCY DEPARTMENT Provider Note   CSN: [id number] Arrival date & time: 11/18/17  0240     History   Chief Complaint Chief Complaint  Patient presents with  . Weakness    HPI Sara Hines is a 83 y.o. female. CC: rectal blood.  HPI: Pt was seen several days ago at [facility] with rectal bleeding. Thought to be perirectal (fissure, hemorrhoid).  Has done well.  Is scheduled for colonoscopy the end of this month.  Presents today.  States she just did not feel herself.  Not frankly weaker orthostatic.  Occasional blood persists but none today.  States she has been "constipated" has not had a bowel movement for 2 days.  She states that since starting on Cardizem for A. fib she has had difficulty with constipation and takes a stool softener.  Past Medical History:  Diagnosis Date  . Atrial fibrillation ([place])   . Bilateral cataracts   . Diverticulosis   . GERD (gastroesophageal reflux disease)   . History of depression   . Hypothyroidism   . Malignant neoplasm of breast (female), unspecified site    invasive duct, right breast  . MVP (mitral valve prolapse)   . Other and unspecified hyperlipidemia   . Personal history of other disorder of urinary system   . Personal history of other disorders of nervous system and sense organs   . Unspecified essential hypertension     Patient Active Problem List   Diagnosis Date Noted  . Mitral valve prolapse 01/01/2013  . Breast cancer, right breast ([place]) 12/11/2012  . ATRIAL FIBRILLATION 07/23/2010    Past Surgical History:  Procedure Laterality Date  . BREAST RECONSTRUCTION  10/2010  . INGUINAL HERNIA REPAIR  1997  . LEFT HEART CATH  1997  . right ear surgery    . SHOULDER SURGERY    . TOTAL MASTECTOMY  07/2010   right    OB History    No data available       Home Medications    Prior to Admission medications   Medication Sig Start Date End Date Taking? Authorizing Provider    amoxicillin-clavulanate (AUGMENTIN) 875-125 MG tablet Take 1 tablet by mouth every 12 (twelve) hours. 12/22/16   Noemi Chapel, MD  Calcium Carbonate-Vitamin D (CALCIUM-D) 600-400 MG-UNIT TABS Take 1 tablet by mouth daily.    [provider]  cetirizine (ZYRTEC) 10 MG tablet Take 10 mg by mouth daily.    [provider]  diltiazem (CARDIZEM) 30 MG tablet Take 30 mg by mouth 2 (two) times daily.  10/31/12   [provider]  docusate sodium (COLACE) 100 MG capsule Take 1 capsule (100 mg total) by mouth every 12 (twelve) hours. 11/10/17   Dorie Rank, MD  fish oil-omega-3 fatty acids 1000 MG capsule Take 1 g by mouth daily.    [provider]  levothyroxine (SYNTHROID, LEVOTHROID) 88 MCG tablet Take 88 mcg by mouth daily before breakfast.  12/27/12   [provider]  losartan (COZAAR) 50 MG tablet Take 50 mg by mouth daily.  11/28/12   [provider]  pantoprazole (PROTONIX) 40 MG tablet Take 1 tablet by mouth daily. Pt takes 40 mg by mouth every morning with breakfast 12/21/14   [provider]    Family History Family History  Problem Relation Age of Onset  . Atrial fibrillation Father   . Dementia Mother   . Diabetes Sister   . Healthy Brother   . Healthy  Sister   . Healthy Sister   . Healthy Brother   . Healthy Brother   . Healthy Brother     Social History Social History   Tobacco Use  . Smoking status: Current Some Day Smoker  . Smokeless tobacco: Never Used  . Tobacco comment: quit 07/2010  Substance Use Topics  . Alcohol use: No  . Drug use: No     Allergies   Aspirin; Darvocet a500 [propoxyphene n-acetaminophen]; Flagyl [metronidazole]; Ibuprofen; and Phenol-yellow jacket venom protein [bee venom]   Review of Systems Review of Systems  Constitutional: Negative for appetite change, chills, diaphoresis, fatigue and fever.  HENT: Negative for mouth sores, sore throat and trouble swallowing.   Eyes: Negative for  visual disturbance.  Respiratory: Negative for cough, chest tightness, shortness of breath and wheezing.   Cardiovascular: Negative for chest pain.  Gastrointestinal: Positive for constipation. Negative for abdominal distention, abdominal pain, diarrhea, nausea and vomiting.       Bleeding after a bowel movement.  Endocrine: Negative for polydipsia, polyphagia and polyuria.  Genitourinary: Negative for dysuria, frequency and hematuria.  Musculoskeletal: Negative for gait problem.  Skin: Negative for color change, pallor and rash.  Neurological: Negative for dizziness, syncope, light-headedness and headaches.  Hematological: Does not bruise/bleed easily.  Psychiatric/Behavioral: Negative for behavioral problems and confusion.     Physical Exam Updated Vital Signs BP (!) 90/47   Pulse 81   Temp 97.8 F (36.6 C) (Oral)   Resp 19   SpO2 100%   Physical Exam  Constitutional: She is oriented to person, place, and time. She appears well-developed and well-nourished. No distress.  Awake and alert.  No distress.  Conjunctival not pale.  HENT:  Head: Normocephalic.  Eyes: Pupils are equal, round, and reactive to light. Conjunctivae are normal. No scleral icterus.  Neck: Normal range of motion. Neck supple. No thyromegaly present.  Cardiovascular: Normal rate and regular rhythm. Exam reveals no gallop and no friction rub.  No murmur heard. Pulmonary/Chest: Effort normal and breath sounds normal. No respiratory distress. She has no wheezes. She has no rales.  Abdominal: Soft. Bowel sounds are normal. She exhibits no distension. There is no tenderness. There is no rebound.  Musculoskeletal: Normal range of motion.  Neurological: She is alert and oriented to person, place, and time.  Skin: Skin is warm and dry. No rash noted.  Psychiatric: She has a normal mood and affect. Her behavior is normal.     ED Treatments / Results  Labs (all labs ordered are listed, but only abnormal results  are displayed) Labs Reviewed  BASIC METABOLIC PANEL - Abnormal; Notable for the following components:      Result Value   Glucose, Bld 109 (*)    Creatinine, Ser 1.21 (*)    GFR calc non Af Amer 41 (*)    GFR calc Af Amer 48 (*)    All other components within normal limits  CBC - Abnormal; Notable for the following components:   RBC 3.50 (*)    Hemoglobin 11.4 (*)    HCT 33.7 (*)    All other components within normal limits    EKG  EKG Interpretation None       Radiology Dg Abd Acute W/chest  Result Date: 11/18/2017 CLINICAL DATA:  Nausea, constipation. EXAM: DG ABDOMEN ACUTE W/ 1V CHEST COMPARISON:  CT scan and radiographs of December 22, 2016. FINDINGS: There is no evidence of dilated bowel loops or free intraperitoneal air. Surgical clips are seen in  the pelvis. No calculi are noted. Mild amount of stool is seen throughout the colon. Heart size and mediastinal contours are within normal limits. Both lungs are clear. IMPRESSION: No evidence of bowel obstruction or ileus. Mild stool burden is noted. No acute cardiopulmonary disease. Electronically Signed   By: Marijo Conception, M.D.   On: 11/18/2017 11:33    Procedures Procedures (including critical care time)  Medications Ordered in ED Medications - No data to display   Initial Impression / Assessment and Plan / ED Course  I have reviewed the triage vital signs and the nursing notes.  Pertinent labs & imaging results that were available during my care of the patient were reviewed by me and considered in my medical decision making (see chart for details).     Reassuring labs.  Not anemic.  Mild to moderate stool volume.  No impaction.  Plan.  MiraLAX.  Continue with plans for GI follow-up.  Final Clinical Impressions(s) / ED Diagnoses   Final diagnoses:  Weakness  Constipation, unspecified constipation type    ED Discharge Orders    None       Tanna Furry, MD 11/19/17 [id number]    Tanna Furry, MD 12/12/17  1041

## 2017-11-30 DIAGNOSIS — K5900 Constipation, unspecified: Secondary | ICD-10-CM | POA: Diagnosis not present

## 2017-11-30 DIAGNOSIS — K625 Hemorrhage of anus and rectum: Secondary | ICD-10-CM | POA: Diagnosis not present

## 2017-11-30 DIAGNOSIS — I4891 Unspecified atrial fibrillation: Secondary | ICD-10-CM | POA: Diagnosis not present

## 2017-12-06 DIAGNOSIS — K625 Hemorrhage of anus and rectum: Secondary | ICD-10-CM | POA: Diagnosis not present

## 2017-12-06 DIAGNOSIS — K5730 Diverticulosis of large intestine without perforation or abscess without bleeding: Secondary | ICD-10-CM | POA: Diagnosis not present

## 2017-12-06 DIAGNOSIS — K6289 Other specified diseases of anus and rectum: Secondary | ICD-10-CM | POA: Diagnosis not present

## 2017-12-06 DIAGNOSIS — K921 Melena: Secondary | ICD-10-CM | POA: Diagnosis not present

## 2018-01-17 DIAGNOSIS — I10 Essential (primary) hypertension: Secondary | ICD-10-CM | POA: Diagnosis not present

## 2018-01-17 DIAGNOSIS — N183 Chronic kidney disease, stage 3 (moderate): Secondary | ICD-10-CM | POA: Diagnosis not present

## 2018-01-17 DIAGNOSIS — R5383 Other fatigue: Secondary | ICD-10-CM | POA: Diagnosis not present

## 2018-01-17 DIAGNOSIS — N390 Urinary tract infection, site not specified: Secondary | ICD-10-CM | POA: Diagnosis not present

## 2018-01-17 DIAGNOSIS — D638 Anemia in other chronic diseases classified elsewhere: Secondary | ICD-10-CM | POA: Diagnosis not present

## 2018-01-17 DIAGNOSIS — K219 Gastro-esophageal reflux disease without esophagitis: Secondary | ICD-10-CM | POA: Diagnosis not present

## 2018-01-17 DIAGNOSIS — E039 Hypothyroidism, unspecified: Secondary | ICD-10-CM | POA: Diagnosis not present

## 2018-01-20 DIAGNOSIS — D509 Iron deficiency anemia, unspecified: Secondary | ICD-10-CM | POA: Diagnosis not present

## 2018-01-20 DIAGNOSIS — D649 Anemia, unspecified: Secondary | ICD-10-CM | POA: Diagnosis not present

## 2018-02-03 DIAGNOSIS — R5383 Other fatigue: Secondary | ICD-10-CM | POA: Diagnosis not present

## 2018-02-06 DIAGNOSIS — E611 Iron deficiency: Secondary | ICD-10-CM | POA: Diagnosis not present

## 2018-02-06 DIAGNOSIS — N390 Urinary tract infection, site not specified: Secondary | ICD-10-CM | POA: Diagnosis not present

## 2018-02-06 DIAGNOSIS — R5383 Other fatigue: Secondary | ICD-10-CM | POA: Diagnosis not present

## 2018-03-13 DIAGNOSIS — R5383 Other fatigue: Secondary | ICD-10-CM | POA: Diagnosis not present

## 2018-03-13 DIAGNOSIS — E7800 Pure hypercholesterolemia, unspecified: Secondary | ICD-10-CM | POA: Diagnosis not present

## 2018-03-13 DIAGNOSIS — E039 Hypothyroidism, unspecified: Secondary | ICD-10-CM | POA: Diagnosis not present

## 2018-03-13 DIAGNOSIS — N390 Urinary tract infection, site not specified: Secondary | ICD-10-CM | POA: Diagnosis not present

## 2018-03-13 DIAGNOSIS — I10 Essential (primary) hypertension: Secondary | ICD-10-CM | POA: Diagnosis not present

## 2018-03-17 DIAGNOSIS — E039 Hypothyroidism, unspecified: Secondary | ICD-10-CM | POA: Diagnosis not present

## 2018-03-17 DIAGNOSIS — I4891 Unspecified atrial fibrillation: Secondary | ICD-10-CM | POA: Diagnosis not present

## 2018-03-17 DIAGNOSIS — Z0000 Encounter for general adult medical examination without abnormal findings: Secondary | ICD-10-CM | POA: Diagnosis not present

## 2018-03-17 DIAGNOSIS — I10 Essential (primary) hypertension: Secondary | ICD-10-CM | POA: Diagnosis not present

## 2018-03-17 DIAGNOSIS — N183 Chronic kidney disease, stage 3 (moderate): Secondary | ICD-10-CM | POA: Diagnosis not present

## 2018-09-18 DIAGNOSIS — Z853 Personal history of malignant neoplasm of breast: Secondary | ICD-10-CM | POA: Diagnosis not present

## 2018-09-18 DIAGNOSIS — E039 Hypothyroidism, unspecified: Secondary | ICD-10-CM | POA: Diagnosis not present

## 2018-09-18 DIAGNOSIS — I4891 Unspecified atrial fibrillation: Secondary | ICD-10-CM | POA: Diagnosis not present

## 2018-09-18 DIAGNOSIS — I341 Nonrheumatic mitral (valve) prolapse: Secondary | ICD-10-CM | POA: Diagnosis not present

## 2019-03-13 DIAGNOSIS — E039 Hypothyroidism, unspecified: Secondary | ICD-10-CM | POA: Diagnosis not present

## 2019-03-13 DIAGNOSIS — I10 Essential (primary) hypertension: Secondary | ICD-10-CM | POA: Diagnosis not present

## 2019-03-13 DIAGNOSIS — E7800 Pure hypercholesterolemia, unspecified: Secondary | ICD-10-CM | POA: Diagnosis not present

## 2019-03-13 DIAGNOSIS — Z0000 Encounter for general adult medical examination without abnormal findings: Secondary | ICD-10-CM | POA: Diagnosis not present

## 2019-03-20 DIAGNOSIS — I10 Essential (primary) hypertension: Secondary | ICD-10-CM | POA: Diagnosis not present

## 2019-03-20 DIAGNOSIS — Z0000 Encounter for general adult medical examination without abnormal findings: Secondary | ICD-10-CM | POA: Diagnosis not present

## 2019-03-20 DIAGNOSIS — M8580 Other specified disorders of bone density and structure, unspecified site: Secondary | ICD-10-CM | POA: Diagnosis not present

## 2019-03-20 DIAGNOSIS — E039 Hypothyroidism, unspecified: Secondary | ICD-10-CM | POA: Diagnosis not present

## 2019-03-20 DIAGNOSIS — M8589 Other specified disorders of bone density and structure, multiple sites: Secondary | ICD-10-CM | POA: Diagnosis not present

## 2019-03-20 DIAGNOSIS — I482 Chronic atrial fibrillation, unspecified: Secondary | ICD-10-CM | POA: Diagnosis not present

## 2019-04-29 ENCOUNTER — Other Ambulatory Visit: Payer: Self-pay

## 2019-04-29 ENCOUNTER — Encounter (HOSPITAL_COMMUNITY): Payer: Self-pay | Admitting: Emergency Medicine

## 2019-04-29 ENCOUNTER — Emergency Department (HOSPITAL_COMMUNITY): Payer: Medicare Other

## 2019-04-29 ENCOUNTER — Emergency Department (HOSPITAL_COMMUNITY)
Admission: EM | Admit: 2019-04-29 | Discharge: 2019-04-29 | Disposition: A | Discharge status: Home / Self Care | Payer: Medicare Other | Attending: Emergency Medicine | Admitting: Emergency Medicine

## 2019-04-29 DIAGNOSIS — R002 Palpitations: Secondary | ICD-10-CM | POA: Diagnosis not present

## 2019-04-29 DIAGNOSIS — I491 Atrial premature depolarization: Secondary | ICD-10-CM | POA: Diagnosis not present

## 2019-04-29 DIAGNOSIS — E039 Hypothyroidism, unspecified: Secondary | ICD-10-CM | POA: Diagnosis not present

## 2019-04-29 DIAGNOSIS — R0902 Hypoxemia: Secondary | ICD-10-CM | POA: Diagnosis not present

## 2019-04-29 DIAGNOSIS — Z79899 Other long term (current) drug therapy: Secondary | ICD-10-CM | POA: Insufficient documentation

## 2019-04-29 DIAGNOSIS — I4891 Unspecified atrial fibrillation: Secondary | ICD-10-CM | POA: Diagnosis not present

## 2019-04-29 DIAGNOSIS — I10 Essential (primary) hypertension: Secondary | ICD-10-CM | POA: Diagnosis not present

## 2019-04-29 DIAGNOSIS — F17210 Nicotine dependence, cigarettes, uncomplicated: Secondary | ICD-10-CM | POA: Insufficient documentation

## 2019-04-29 DIAGNOSIS — R0602 Shortness of breath: Secondary | ICD-10-CM | POA: Diagnosis not present

## 2019-04-29 LAB — CBC WITH DIFFERENTIAL/PLATELET
Abs Immature Granulocytes: 0.03 10*3/uL (ref 0.00–0.07)
Basophils Absolute: 0.1 10*3/uL (ref 0.0–0.1)
Basophils Relative: 1 %
Eosinophils Absolute: 0.3 10*3/uL (ref 0.0–0.5)
Eosinophils Relative: 4 %
HCT: 36.6 % (ref 36.0–46.0)
Hemoglobin: 12 g/dL (ref 12.0–15.0)
Immature Granulocytes: 1 %
Lymphocytes Relative: 51 %
Lymphs Abs: 3.2 10*3/uL (ref 0.7–4.0)
MCH: 33.1 pg (ref 26.0–34.0)
MCHC: 32.8 g/dL (ref 30.0–36.0)
MCV: 101.1 fL — ABNORMAL HIGH (ref 80.0–100.0)
Monocytes Absolute: 0.7 10*3/uL (ref 0.1–1.0)
Monocytes Relative: 11 %
Neutro Abs: 2.1 10*3/uL (ref 1.7–7.7)
Neutrophils Relative %: 32 %
Platelets: 268 10*3/uL (ref 150–400)
RBC: 3.62 MIL/uL — ABNORMAL LOW (ref 3.87–5.11)
RDW: 13 % (ref 11.5–15.5)
WBC: 6.4 10*3/uL (ref 4.0–10.5)
nRBC: 0 % (ref 0.0–0.2)

## 2019-04-29 LAB — BASIC METABOLIC PANEL
Anion gap: 11 (ref 5–15)
BUN: 16 mg/dL (ref 8–23)
CO2: 22 mmol/L (ref 22–32)
Calcium: 9.5 mg/dL (ref 8.9–10.3)
Chloride: 103 mmol/L (ref 98–111)
Creatinine, Ser: 1.47 mg/dL — ABNORMAL HIGH (ref 0.44–1.00)
GFR calc Af Amer: 38 mL/min — ABNORMAL LOW (ref >60)
GFR calc non Af Amer: 33 mL/min — ABNORMAL LOW (ref >60)
Glucose, Bld: 105 mg/dL — ABNORMAL HIGH (ref 70–99)
Potassium: 4 mmol/L (ref 3.5–5.1)
Sodium: 136 mmol/L (ref 135–145)

## 2019-04-29 LAB — MAGNESIUM: Magnesium: 2.2 mg/dL (ref 1.7–2.4)

## 2019-04-29 NOTE — ED Notes (Signed)
Discharge instructions discussed with pt. Pt verbalized understanding and no questions at this time.   Pt to go home with son who is on his way.

## 2019-04-29 NOTE — ED Notes (Signed)
EDP at bedside  

## 2019-04-29 NOTE — ED Provider Notes (Signed)
[facility] EMERGENCY DEPARTMENT Provider Note   CSN: [id number] Arrival date & time: 04/29/19  5643     History   Chief Complaint Chief Complaint  Patient presents with  . Shortness of Breath    HPI Sara Hines is a 84 y.o. female.      Shortness of Breath Severity:  Mild Onset quality:  Sudden Duration:  1 minute Timing:  Rare Progression:  Resolved Chronicity:  New Context: not activity   Relieved by:  None tried Worsened by:  Nothing Associated symptoms: no abdominal pain, no chest pain, no cough, no diaphoresis, no hemoptysis and no syncope     Past Medical History:  Diagnosis Date  . Atrial fibrillation ([place])   . Bilateral cataracts   . Diverticulosis   . GERD (gastroesophageal reflux disease)   . History of depression   . Hypothyroidism   . Malignant neoplasm of breast (female), unspecified site    invasive duct, right breast  . MVP (mitral valve prolapse)   . Other and unspecified hyperlipidemia   . Personal history of other disorder of urinary system   . Personal history of other disorders of nervous system and sense organs   . Unspecified essential hypertension     Patient Active Problem List   Diagnosis Date Noted  . Mitral valve prolapse 01/01/2013  . Breast cancer, right breast ([place]) 12/11/2012  . ATRIAL FIBRILLATION 07/23/2010    Past Surgical History:  Procedure Laterality Date  . BREAST RECONSTRUCTION  10/2010  . INGUINAL HERNIA REPAIR  1997  . LEFT HEART CATH  1997  . right ear surgery    . SHOULDER SURGERY    . TOTAL MASTECTOMY  07/2010   right     OB History   No obstetric history on file.      Home Medications    Prior to Admission medications   Medication Sig Start Date End Date Taking? Authorizing Provider  amoxicillin-clavulanate (AUGMENTIN) 875-125 MG tablet Take 1 tablet by mouth every 12 (twelve) hours. 12/22/16   Noemi Chapel, MD  Calcium Carbonate-Vitamin D (CALCIUM-D) 600-400 MG-UNIT TABS  Take 1 tablet by mouth daily.    [provider]  cetirizine (ZYRTEC) 10 MG tablet Take 10 mg by mouth daily.    [provider]  diltiazem (CARDIZEM) 30 MG tablet Take 30 mg by mouth 2 (two) times daily.  10/31/12   [provider]  docusate sodium (COLACE) 100 MG capsule Take 1 capsule (100 mg total) by mouth every 12 (twelve) hours. 11/10/17   Dorie Rank, MD  fish oil-omega-3 fatty acids 1000 MG capsule Take 1 g by mouth daily.    [provider]  levothyroxine (SYNTHROID, LEVOTHROID) 88 MCG tablet Take 88 mcg by mouth daily before breakfast.  12/27/12   [provider]  losartan (COZAAR) 50 MG tablet Take 50 mg by mouth daily.  11/28/12   [provider]  pantoprazole (PROTONIX) 40 MG tablet Take 1 tablet by mouth daily. Pt takes 40 mg by mouth every morning with breakfast 12/21/14   [provider]    Family History Family History  Problem Relation Age of Onset  . Atrial fibrillation Father   . Dementia Mother   . Diabetes Sister   . Healthy Brother   . Healthy Sister   . Healthy Sister   . Healthy Brother   . Healthy Brother   . Healthy Brother     Social History Social History   Tobacco Use  .  Smoking status: Current Some Day Smoker  . Smokeless tobacco: Never Used  . Tobacco comment: quit 07/2010  Substance Use Topics  . Alcohol use: No  . Drug use: No     Allergies   Aspirin, Darvocet a500 [propoxyphene n-acetaminophen], Flagyl [metronidazole], Ibuprofen, and Phenol-yellow jacket venom protein [bee venom]   Review of Systems Review of Systems  Constitutional: Negative for diaphoresis.  Respiratory: Positive for shortness of breath. Negative for cough and hemoptysis.   Cardiovascular: Negative for chest pain and syncope.  Gastrointestinal: Negative for abdominal pain.  All other systems reviewed and are negative.    Physical Exam Updated Vital Signs BP (!) 138/58   Pulse (!) 50   Temp (!) 97.4 F  (36.3 C) (Oral)   Resp 16   Ht 5\' 6"  (1.676 m)   Wt 49.9 kg   SpO2 98%   BMI 17.75 kg/m   Physical Exam Vitals signs and nursing note reviewed.  Constitutional:      Appearance: She is well-developed.  HENT:     Head: Normocephalic and atraumatic.  Eyes:     Pupils: Pupils are equal, round, and reactive to light.  Neck:     Musculoskeletal: Normal range of motion.  Cardiovascular:     Rate and Rhythm: Normal rate and regular rhythm.  Pulmonary:     Effort: Pulmonary effort is normal. No tachypnea or respiratory distress.     Breath sounds: No stridor. No decreased breath sounds, wheezing, rhonchi or rales.  Abdominal:     General: There is no distension.  Musculoskeletal: Normal range of motion.     Right lower leg: She exhibits no tenderness. No edema.     Left lower leg: She exhibits no tenderness. No edema.  Skin:    General: Skin is warm and dry.  Neurological:     General: No focal deficit present.     Mental Status: She is alert.      ED Treatments / Results  Labs (all labs ordered are listed, but only abnormal results are displayed) Labs Reviewed  CBC WITH DIFFERENTIAL/PLATELET - Abnormal; Notable for the following components:      Result Value   RBC 3.62 (*)    MCV 101.1 (*)    All other components within normal limits  BASIC METABOLIC PANEL - Abnormal; Notable for the following components:   Glucose, Bld 105 (*)    Creatinine, Ser 1.47 (*)    GFR calc non Af Amer 33 (*)    GFR calc Af Amer 38 (*)    All other components within normal limits  MAGNESIUM    EKG EKG Interpretation  Date/Time:  Sunday April 29 2019 02:45:07 EDT Ventricular Rate:  70 PR Interval:    QRS Duration: 89 QT Interval:  436 QTC Calculation: 471 R Axis:   80 Text Interpretation:  Atrial fibrillation No significant change since last tracing Confirmed by Merrily Pew [phone]) on 04/29/2019 3:09:10 AM   Radiology Dg Chest 2 View  Result Date: 04/29/2019 CLINICAL DATA:   Acute onset of shortness of breath. EXAM: CHEST - 2 VIEW COMPARISON:  12/22/2016 FINDINGS: The cardiomediastinal contours are normal. Minimal linear atelectasis anteriorly, likely in the right middle lobe. Pulmonary vasculature is normal. No consolidation, pleural effusion, or pneumothorax. No acute osseous abnormalities are seen. Prior right mastectomy. IMPRESSION: No acute chest findings. Electronically Signed   By: Keith Rake M.D.   On: 04/29/2019 03:53    Procedures Procedures (including critical care time)  Medications Ordered  in ED Medications - No data to display   Initial Impression / Assessment and Plan / ED Course  I have reviewed the triage vital signs and the nursing notes.  Pertinent labs & imaging results that were available during my care of the patient were reviewed by me and considered in my medical decision making (see chart for details).        States she was at home and had a few seconds of heavy breathing so she called EMS.  States she took extra diltiazem because her heart rate was low.  I discussed with her that there was not appropriate action.  Is been a few hours since she did that and her heart rate is consistently in the 50-70 range with atrial fibrillation.  EMS stated that she had multiple PVCs but not seen here.  Work-up negative. No e/o pneumonia, electrolyte abnormality, afib rvr, vtach or other causes for her SOB. Xr clear. Low susp for PE. Stable for discharge.   Final Clinical Impressions(s) / ED Diagnoses   Final diagnoses:  Palpitations    ED Discharge Orders    None       Diar Berkel, Corene Cornea, MD 04/29/19 [phone]

## 2019-04-29 NOTE — ED Triage Notes (Addendum)
Pt BIB [facility] from home c/o SOB. Pt states sudden SOB tonight. SOB intermittent worsens.  Per EMS pt with PVC's 6 beats for 3 occasions. SOB correlates with SOB . HX chronic a fib, HTN, and Left heart cath in 1997  EMS VS BP 110/80 HR 50-70s   Pt states "I feel like my breathing isn't good." Pt denies pain, dizziness, palpitations at this time. Pt denies cough. RR 24 and SpO2 100% on room air. No fevers. No chills. No exposure.

## 2019-06-06 ENCOUNTER — Telehealth: Payer: Self-pay | Admitting: Cardiology

## 2019-06-06 NOTE — Telephone Encounter (Signed)
Called patient, but, phone just rang and no voicemail.

## 2019-10-09 DIAGNOSIS — E039 Hypothyroidism, unspecified: Secondary | ICD-10-CM | POA: Diagnosis not present

## 2019-10-09 DIAGNOSIS — R7309 Other abnormal glucose: Secondary | ICD-10-CM | POA: Diagnosis not present

## 2019-10-09 DIAGNOSIS — I10 Essential (primary) hypertension: Secondary | ICD-10-CM | POA: Diagnosis not present

## 2019-10-09 DIAGNOSIS — E7800 Pure hypercholesterolemia, unspecified: Secondary | ICD-10-CM | POA: Diagnosis not present

## 2019-10-16 DIAGNOSIS — R7301 Impaired fasting glucose: Secondary | ICD-10-CM | POA: Diagnosis not present

## 2019-10-16 DIAGNOSIS — I10 Essential (primary) hypertension: Secondary | ICD-10-CM | POA: Diagnosis not present

## 2019-10-16 DIAGNOSIS — E039 Hypothyroidism, unspecified: Secondary | ICD-10-CM | POA: Diagnosis not present

## 2019-10-16 DIAGNOSIS — I4891 Unspecified atrial fibrillation: Secondary | ICD-10-CM | POA: Diagnosis not present

## 2019-10-16 DIAGNOSIS — K219 Gastro-esophageal reflux disease without esophagitis: Secondary | ICD-10-CM | POA: Diagnosis not present

## 2020-03-19 DIAGNOSIS — I10 Essential (primary) hypertension: Secondary | ICD-10-CM | POA: Diagnosis not present

## 2020-03-19 DIAGNOSIS — E7800 Pure hypercholesterolemia, unspecified: Secondary | ICD-10-CM | POA: Diagnosis not present

## 2020-03-24 DIAGNOSIS — M8580 Other specified disorders of bone density and structure, unspecified site: Secondary | ICD-10-CM | POA: Diagnosis not present

## 2020-03-24 DIAGNOSIS — N1832 Chronic kidney disease, stage 3b: Secondary | ICD-10-CM | POA: Diagnosis not present

## 2020-03-24 DIAGNOSIS — Z0000 Encounter for general adult medical examination without abnormal findings: Secondary | ICD-10-CM | POA: Diagnosis not present

## 2020-03-24 DIAGNOSIS — E039 Hypothyroidism, unspecified: Secondary | ICD-10-CM | POA: Diagnosis not present

## 2020-03-24 DIAGNOSIS — I10 Essential (primary) hypertension: Secondary | ICD-10-CM | POA: Diagnosis not present

## 2021-03-23 DIAGNOSIS — E039 Hypothyroidism, unspecified: Secondary | ICD-10-CM | POA: Diagnosis not present

## 2021-03-23 DIAGNOSIS — I10 Essential (primary) hypertension: Secondary | ICD-10-CM | POA: Diagnosis not present

## 2021-03-30 DIAGNOSIS — K219 Gastro-esophageal reflux disease without esophagitis: Secondary | ICD-10-CM | POA: Diagnosis not present

## 2021-03-30 DIAGNOSIS — Z0000 Encounter for general adult medical examination without abnormal findings: Secondary | ICD-10-CM | POA: Diagnosis not present

## 2021-03-30 DIAGNOSIS — M8580 Other specified disorders of bone density and structure, unspecified site: Secondary | ICD-10-CM | POA: Diagnosis not present

## 2021-03-30 DIAGNOSIS — I4891 Unspecified atrial fibrillation: Secondary | ICD-10-CM | POA: Diagnosis not present

## 2021-03-30 DIAGNOSIS — R8271 Bacteriuria: Secondary | ICD-10-CM | POA: Diagnosis not present

## 2021-03-30 DIAGNOSIS — E7800 Pure hypercholesterolemia, unspecified: Secondary | ICD-10-CM | POA: Diagnosis not present

## 2021-03-30 DIAGNOSIS — J309 Allergic rhinitis, unspecified: Secondary | ICD-10-CM | POA: Diagnosis not present

## 2021-03-30 DIAGNOSIS — N1832 Chronic kidney disease, stage 3b: Secondary | ICD-10-CM | POA: Diagnosis not present

## 2021-06-03 DIAGNOSIS — M810 Age-related osteoporosis without current pathological fracture: Secondary | ICD-10-CM | POA: Diagnosis not present

## 2021-07-24 ENCOUNTER — Encounter (HOSPITAL_COMMUNITY): Payer: Self-pay | Admitting: Emergency Medicine

## 2021-07-24 ENCOUNTER — Other Ambulatory Visit: Payer: Self-pay

## 2021-07-24 ENCOUNTER — Emergency Department (HOSPITAL_COMMUNITY)
Admission: EM | Admit: 2021-07-24 | Discharge: 2021-07-24 | Discharge status: Against Medical Advice | Payer: Medicare Other | Attending: Emergency Medicine | Admitting: Emergency Medicine

## 2021-07-24 DIAGNOSIS — M542 Cervicalgia: Secondary | ICD-10-CM | POA: Insufficient documentation

## 2021-07-24 DIAGNOSIS — Z5321 Procedure and treatment not carried out due to patient leaving prior to being seen by health care provider: Secondary | ICD-10-CM | POA: Diagnosis not present

## 2021-07-24 DIAGNOSIS — R9431 Abnormal electrocardiogram [ECG] [EKG]: Secondary | ICD-10-CM | POA: Diagnosis not present

## 2021-07-24 DIAGNOSIS — R519 Headache, unspecified: Secondary | ICD-10-CM | POA: Diagnosis not present

## 2021-07-24 LAB — COMPREHENSIVE METABOLIC PANEL
ALT: 11 U/L (ref 0–44)
AST: 23 U/L (ref 15–41)
Albumin: 3.9 g/dL (ref 3.5–5.0)
Alkaline Phosphatase: 64 U/L (ref 38–126)
Anion gap: 8 (ref 5–15)
BUN: 23 mg/dL (ref 8–23)
CO2: 21 mmol/L — ABNORMAL LOW (ref 22–32)
Calcium: 9.7 mg/dL (ref 8.9–10.3)
Chloride: 105 mmol/L (ref 98–111)
Creatinine, Ser: 1.45 mg/dL — ABNORMAL HIGH (ref 0.44–1.00)
GFR, Estimated: 36 mL/min — ABNORMAL LOW (ref >60)
Glucose, Bld: 104 mg/dL — ABNORMAL HIGH (ref 70–99)
Potassium: 4.3 mmol/L (ref 3.5–5.1)
Sodium: 134 mmol/L — ABNORMAL LOW (ref 135–145)
Total Bilirubin: 0.9 mg/dL (ref 0.3–1.2)
Total Protein: 7.4 g/dL (ref 6.5–8.1)

## 2021-07-24 LAB — CBC WITH DIFFERENTIAL/PLATELET
Abs Immature Granulocytes: 0.04 10*3/uL (ref 0.00–0.07)
Basophils Absolute: 0 10*3/uL (ref 0.0–0.1)
Basophils Relative: 1 %
Eosinophils Absolute: 0.1 10*3/uL (ref 0.0–0.5)
Eosinophils Relative: 2 %
HCT: 33.3 % — ABNORMAL LOW (ref 36.0–46.0)
Hemoglobin: 10.8 g/dL — ABNORMAL LOW (ref 12.0–15.0)
Immature Granulocytes: 1 %
Lymphocytes Relative: 42 %
Lymphs Abs: 2 10*3/uL (ref 0.7–4.0)
MCH: 34.5 pg — ABNORMAL HIGH (ref 26.0–34.0)
MCHC: 32.4 g/dL (ref 30.0–36.0)
MCV: 106.4 fL — ABNORMAL HIGH (ref 80.0–100.0)
Monocytes Absolute: 0.4 10*3/uL (ref 0.1–1.0)
Monocytes Relative: 8 %
Neutro Abs: 2.3 10*3/uL (ref 1.7–7.7)
Neutrophils Relative %: 46 %
Platelets: 216 10*3/uL (ref 150–400)
RBC: 3.13 MIL/uL — ABNORMAL LOW (ref 3.87–5.11)
RDW: 14.4 % (ref 11.5–15.5)
WBC: 4.9 10*3/uL (ref 4.0–10.5)
nRBC: 0 % (ref 0.0–0.2)

## 2021-07-24 NOTE — ED Triage Notes (Signed)
Patient coming from home, states she woke up last night and started having neck pain. Does not endorse any injury.

## 2021-07-24 NOTE — ED Provider Notes (Signed)
Emergency Medicine Provider Triage Evaluation Note  Sara Hines , a 86 y.o. female  was evaluated in triage.  Pt complains of sudden onset of neck pain.  Pain started last night and has been constant since then.  Patient denies any recent falls or injuries.  No aggravation of symptoms.    Review of Systems  Positive: Neck pain Negative: Headache, visual disturbance, numbness, weakness, dysarthria, chest pain, shortness of breath  Physical Exam  BP 122/89 (BP Location: Left Arm)   Pulse 70   Temp (!) 97.3 F (36.3 C) (Oral)   Resp 16   SpO2 100%  Gen:   Awake, no distress   Resp:  Normal effort  MSK:   Moves extremities without difficulty  Other:  Patient has full range of motion to neck.  Left TM and ear canal normal.  No carotid bruits  Medical Decision Making  Medically screening exam initiated at 12:28 PM.  Appropriate orders placed.  Sara Hines was informed that the remainder of the evaluation will be completed by another provider, this initial triage assessment does not replace that evaluation, and the importance of remaining in the ED until their evaluation is complete.     Loni Beckwith, PA-C 07/24/21 1229    Hayden Rasmussen, MD 07/24/21 1827

## 2021-07-24 NOTE — ED Notes (Signed)
Pt called for vitals, no response. 

## 2022-03-30 DIAGNOSIS — I10 Essential (primary) hypertension: Secondary | ICD-10-CM | POA: Diagnosis not present

## 2022-03-30 DIAGNOSIS — E039 Hypothyroidism, unspecified: Secondary | ICD-10-CM | POA: Diagnosis not present

## 2022-04-02 DIAGNOSIS — J309 Allergic rhinitis, unspecified: Secondary | ICD-10-CM | POA: Diagnosis not present

## 2022-04-02 DIAGNOSIS — M8580 Other specified disorders of bone density and structure, unspecified site: Secondary | ICD-10-CM | POA: Diagnosis not present

## 2022-04-02 DIAGNOSIS — E039 Hypothyroidism, unspecified: Secondary | ICD-10-CM | POA: Diagnosis not present

## 2022-04-02 DIAGNOSIS — I10 Essential (primary) hypertension: Secondary | ICD-10-CM | POA: Diagnosis not present

## 2022-04-02 DIAGNOSIS — N184 Chronic kidney disease, stage 4 (severe): Secondary | ICD-10-CM | POA: Diagnosis not present

## 2022-04-02 DIAGNOSIS — Z0000 Encounter for general adult medical examination without abnormal findings: Secondary | ICD-10-CM | POA: Diagnosis not present

## 2022-04-02 DIAGNOSIS — I4891 Unspecified atrial fibrillation: Secondary | ICD-10-CM | POA: Diagnosis not present

## 2022-06-08 ENCOUNTER — Emergency Department (HOSPITAL_COMMUNITY): Payer: Medicare Other

## 2022-06-08 ENCOUNTER — Other Ambulatory Visit: Payer: Self-pay

## 2022-06-08 ENCOUNTER — Encounter (HOSPITAL_COMMUNITY): Payer: Self-pay | Admitting: General Surgery

## 2022-06-08 ENCOUNTER — Inpatient Hospital Stay (HOSPITAL_COMMUNITY)
Admission: EM | Admit: 2022-06-08 | Discharge: 2022-07-07 | DRG: 208 | Disposition: E | Payer: Medicare Other | Attending: Surgery | Admitting: Surgery

## 2022-06-08 DIAGNOSIS — Z4682 Encounter for fitting and adjustment of non-vascular catheter: Secondary | ICD-10-CM | POA: Diagnosis not present

## 2022-06-08 DIAGNOSIS — S0990XA Unspecified injury of head, initial encounter: Secondary | ICD-10-CM | POA: Diagnosis not present

## 2022-06-08 DIAGNOSIS — E039 Hypothyroidism, unspecified: Secondary | ICD-10-CM | POA: Diagnosis not present

## 2022-06-08 DIAGNOSIS — Z79890 Hormone replacement therapy (postmenopausal): Secondary | ICD-10-CM

## 2022-06-08 DIAGNOSIS — Z853 Personal history of malignant neoplasm of breast: Secondary | ICD-10-CM

## 2022-06-08 DIAGNOSIS — S22020A Wedge compression fracture of second thoracic vertebra, initial encounter for closed fracture: Secondary | ICD-10-CM | POA: Diagnosis not present

## 2022-06-08 DIAGNOSIS — I4891 Unspecified atrial fibrillation: Secondary | ICD-10-CM | POA: Diagnosis not present

## 2022-06-08 DIAGNOSIS — Z79899 Other long term (current) drug therapy: Secondary | ICD-10-CM | POA: Diagnosis not present

## 2022-06-08 DIAGNOSIS — S22028A Other fracture of second thoracic vertebra, initial encounter for closed fracture: Secondary | ICD-10-CM | POA: Diagnosis present

## 2022-06-08 DIAGNOSIS — S2243XA Multiple fractures of ribs, bilateral, initial encounter for closed fracture: Secondary | ICD-10-CM | POA: Diagnosis not present

## 2022-06-08 DIAGNOSIS — Z515 Encounter for palliative care: Secondary | ICD-10-CM

## 2022-06-08 DIAGNOSIS — D6869 Other thrombophilia: Secondary | ICD-10-CM | POA: Diagnosis not present

## 2022-06-08 DIAGNOSIS — R64 Cachexia: Secondary | ICD-10-CM | POA: Diagnosis present

## 2022-06-08 DIAGNOSIS — Z9011 Acquired absence of right breast and nipple: Secondary | ICD-10-CM | POA: Diagnosis not present

## 2022-06-08 DIAGNOSIS — J9601 Acute respiratory failure with hypoxia: Secondary | ICD-10-CM | POA: Diagnosis not present

## 2022-06-08 DIAGNOSIS — Z743 Need for continuous supervision: Secondary | ICD-10-CM | POA: Diagnosis not present

## 2022-06-08 DIAGNOSIS — S225XXA Flail chest, initial encounter for closed fracture: Secondary | ICD-10-CM | POA: Diagnosis not present

## 2022-06-08 DIAGNOSIS — M542 Cervicalgia: Secondary | ICD-10-CM | POA: Diagnosis not present

## 2022-06-08 DIAGNOSIS — M19012 Primary osteoarthritis, left shoulder: Secondary | ICD-10-CM | POA: Diagnosis not present

## 2022-06-08 DIAGNOSIS — J811 Chronic pulmonary edema: Secondary | ICD-10-CM | POA: Diagnosis not present

## 2022-06-08 DIAGNOSIS — S4992XA Unspecified injury of left shoulder and upper arm, initial encounter: Secondary | ICD-10-CM | POA: Diagnosis not present

## 2022-06-08 DIAGNOSIS — R918 Other nonspecific abnormal finding of lung field: Secondary | ICD-10-CM | POA: Diagnosis not present

## 2022-06-08 DIAGNOSIS — J439 Emphysema, unspecified: Secondary | ICD-10-CM | POA: Diagnosis not present

## 2022-06-08 DIAGNOSIS — S27321A Contusion of lung, unilateral, initial encounter: Secondary | ICD-10-CM | POA: Diagnosis not present

## 2022-06-08 DIAGNOSIS — I10 Essential (primary) hypertension: Secondary | ICD-10-CM | POA: Diagnosis not present

## 2022-06-08 DIAGNOSIS — S3993XA Unspecified injury of pelvis, initial encounter: Secondary | ICD-10-CM | POA: Diagnosis not present

## 2022-06-08 DIAGNOSIS — J939 Pneumothorax, unspecified: Secondary | ICD-10-CM | POA: Diagnosis not present

## 2022-06-08 DIAGNOSIS — K5730 Diverticulosis of large intestine without perforation or abscess without bleeding: Secondary | ICD-10-CM | POA: Diagnosis not present

## 2022-06-08 DIAGNOSIS — M47816 Spondylosis without myelopathy or radiculopathy, lumbar region: Secondary | ICD-10-CM | POA: Diagnosis not present

## 2022-06-08 DIAGNOSIS — R079 Chest pain, unspecified: Secondary | ICD-10-CM | POA: Diagnosis not present

## 2022-06-08 DIAGNOSIS — Z66 Do not resuscitate: Secondary | ICD-10-CM | POA: Diagnosis not present

## 2022-06-08 DIAGNOSIS — S2242XA Multiple fractures of ribs, left side, initial encounter for closed fracture: Secondary | ICD-10-CM | POA: Diagnosis not present

## 2022-06-08 DIAGNOSIS — Z041 Encounter for examination and observation following transport accident: Secondary | ICD-10-CM | POA: Diagnosis not present

## 2022-06-08 DIAGNOSIS — S81011A Laceration without foreign body, right knee, initial encounter: Secondary | ICD-10-CM | POA: Diagnosis present

## 2022-06-08 DIAGNOSIS — M40204 Unspecified kyphosis, thoracic region: Secondary | ICD-10-CM | POA: Diagnosis not present

## 2022-06-08 DIAGNOSIS — S22071A Stable burst fracture of T9-T10 vertebra, initial encounter for closed fracture: Secondary | ICD-10-CM | POA: Diagnosis present

## 2022-06-08 DIAGNOSIS — M549 Dorsalgia, unspecified: Secondary | ICD-10-CM | POA: Diagnosis not present

## 2022-06-08 DIAGNOSIS — S32038A Other fracture of third lumbar vertebra, initial encounter for closed fracture: Secondary | ICD-10-CM | POA: Diagnosis not present

## 2022-06-08 DIAGNOSIS — E785 Hyperlipidemia, unspecified: Secondary | ICD-10-CM | POA: Diagnosis not present

## 2022-06-08 DIAGNOSIS — I2489 Other forms of acute ischemic heart disease: Secondary | ICD-10-CM | POA: Diagnosis not present

## 2022-06-08 DIAGNOSIS — Z681 Body mass index (BMI) 19 or less, adult: Secondary | ICD-10-CM | POA: Diagnosis not present

## 2022-06-08 DIAGNOSIS — S27331A Laceration of lung, unilateral, initial encounter: Secondary | ICD-10-CM | POA: Diagnosis not present

## 2022-06-08 DIAGNOSIS — D62 Acute posthemorrhagic anemia: Secondary | ICD-10-CM | POA: Diagnosis present

## 2022-06-08 DIAGNOSIS — R0789 Other chest pain: Secondary | ICD-10-CM | POA: Diagnosis present

## 2022-06-08 DIAGNOSIS — S22038A Other fracture of third thoracic vertebra, initial encounter for closed fracture: Secondary | ICD-10-CM | POA: Diagnosis present

## 2022-06-08 DIAGNOSIS — M47814 Spondylosis without myelopathy or radiculopathy, thoracic region: Secondary | ICD-10-CM | POA: Diagnosis not present

## 2022-06-08 DIAGNOSIS — F17210 Nicotine dependence, cigarettes, uncomplicated: Secondary | ICD-10-CM | POA: Diagnosis not present

## 2022-06-08 DIAGNOSIS — I4821 Permanent atrial fibrillation: Secondary | ICD-10-CM | POA: Diagnosis not present

## 2022-06-08 DIAGNOSIS — R404 Transient alteration of awareness: Secondary | ICD-10-CM | POA: Diagnosis not present

## 2022-06-08 DIAGNOSIS — K3189 Other diseases of stomach and duodenum: Secondary | ICD-10-CM | POA: Diagnosis not present

## 2022-06-08 DIAGNOSIS — S22061A Stable burst fracture of T7-T8 vertebra, initial encounter for closed fracture: Secondary | ICD-10-CM | POA: Diagnosis not present

## 2022-06-08 DIAGNOSIS — R0902 Hypoxemia: Secondary | ICD-10-CM | POA: Diagnosis not present

## 2022-06-08 DIAGNOSIS — S22030A Wedge compression fracture of third thoracic vertebra, initial encounter for closed fracture: Secondary | ICD-10-CM | POA: Diagnosis not present

## 2022-06-08 DIAGNOSIS — N281 Cyst of kidney, acquired: Secondary | ICD-10-CM | POA: Diagnosis not present

## 2022-06-08 DIAGNOSIS — Y92413 State road as the place of occurrence of the external cause: Secondary | ICD-10-CM

## 2022-06-08 DIAGNOSIS — Z833 Family history of diabetes mellitus: Secondary | ICD-10-CM

## 2022-06-08 DIAGNOSIS — S270XXA Traumatic pneumothorax, initial encounter: Secondary | ICD-10-CM | POA: Diagnosis not present

## 2022-06-08 DIAGNOSIS — J9811 Atelectasis: Secondary | ICD-10-CM | POA: Diagnosis not present

## 2022-06-08 DIAGNOSIS — S2249XA Multiple fractures of ribs, unspecified side, initial encounter for closed fracture: Secondary | ICD-10-CM | POA: Diagnosis present

## 2022-06-08 DIAGNOSIS — S4991XA Unspecified injury of right shoulder and upper arm, initial encounter: Secondary | ICD-10-CM | POA: Diagnosis not present

## 2022-06-08 DIAGNOSIS — M19011 Primary osteoarthritis, right shoulder: Secondary | ICD-10-CM | POA: Diagnosis not present

## 2022-06-08 DIAGNOSIS — K219 Gastro-esophageal reflux disease without esophagitis: Secondary | ICD-10-CM | POA: Diagnosis present

## 2022-06-08 DIAGNOSIS — R7989 Other specified abnormal findings of blood chemistry: Secondary | ICD-10-CM | POA: Diagnosis not present

## 2022-06-08 DIAGNOSIS — J90 Pleural effusion, not elsewhere classified: Secondary | ICD-10-CM | POA: Diagnosis not present

## 2022-06-08 LAB — CBC
HCT: 33.6 % — ABNORMAL LOW (ref 36.0–46.0)
Hemoglobin: 10.9 g/dL — ABNORMAL LOW (ref 12.0–15.0)
MCH: 34.3 pg — ABNORMAL HIGH (ref 26.0–34.0)
MCHC: 32.4 g/dL (ref 30.0–36.0)
MCV: 105.7 fL — ABNORMAL HIGH (ref 80.0–100.0)
Platelets: 186 10*3/uL (ref 150–400)
RBC: 3.18 MIL/uL — ABNORMAL LOW (ref 3.87–5.11)
RDW: 14.3 % (ref 11.5–15.5)
WBC: 13.7 10*3/uL — ABNORMAL HIGH (ref 4.0–10.5)
nRBC: 0.1 % (ref 0.0–0.2)

## 2022-06-08 LAB — TROPONIN I (HIGH SENSITIVITY)
Troponin I (High Sensitivity): 215 ng/L (ref <18)
Troponin I (High Sensitivity): 655 ng/L (ref <18)

## 2022-06-08 LAB — I-STAT CHEM 8, ED
BUN: 27 mg/dL — ABNORMAL HIGH (ref 8–23)
Calcium, Ion: 1.19 mmol/L (ref 1.15–1.40)
Chloride: 107 mmol/L (ref 98–111)
Creatinine, Ser: 2 mg/dL — ABNORMAL HIGH (ref 0.44–1.00)
Glucose, Bld: 133 mg/dL — ABNORMAL HIGH (ref 70–99)
HCT: 32 % — ABNORMAL LOW (ref 36.0–46.0)
Hemoglobin: 10.9 g/dL — ABNORMAL LOW (ref 12.0–15.0)
Potassium: 4.7 mmol/L (ref 3.5–5.1)
Sodium: 138 mmol/L (ref 135–145)
TCO2: 23 mmol/L (ref 22–32)

## 2022-06-08 LAB — APTT: aPTT: 26 seconds (ref 24–36)

## 2022-06-08 LAB — PROTIME-INR
INR: 1.1 (ref 0.8–1.2)
Prothrombin Time: 13.8 seconds (ref 11.4–15.2)

## 2022-06-08 LAB — TYPE AND SCREEN
ABO/RH(D): O NEG
Antibody Screen: NEGATIVE

## 2022-06-08 LAB — LACTIC ACID, PLASMA: Lactic Acid, Venous: 1.5 mmol/L (ref 0.5–1.9)

## 2022-06-08 LAB — COMPREHENSIVE METABOLIC PANEL
ALT: 35 U/L (ref 0–44)
AST: 87 U/L — ABNORMAL HIGH (ref 15–41)
Albumin: 4 g/dL (ref 3.5–5.0)
Alkaline Phosphatase: 69 U/L (ref 38–126)
Anion gap: 11 (ref 5–15)
BUN: 24 mg/dL — ABNORMAL HIGH (ref 8–23)
CO2: 21 mmol/L — ABNORMAL LOW (ref 22–32)
Calcium: 9.8 mg/dL (ref 8.9–10.3)
Chloride: 106 mmol/L (ref 98–111)
Creatinine, Ser: 1.85 mg/dL — ABNORMAL HIGH (ref 0.44–1.00)
GFR, Estimated: 27 mL/min — ABNORMAL LOW (ref >60)
Glucose, Bld: 136 mg/dL — ABNORMAL HIGH (ref 70–99)
Potassium: 4.8 mmol/L (ref 3.5–5.1)
Sodium: 138 mmol/L (ref 135–145)
Total Bilirubin: 0.7 mg/dL (ref 0.3–1.2)
Total Protein: 7.3 g/dL (ref 6.5–8.1)

## 2022-06-08 LAB — ETHANOL: Alcohol, Ethyl (B): <10 mg/dL (ref <10)

## 2022-06-08 MED ORDER — ONDANSETRON 4 MG PO TBDP: 4.0000 mg | ORAL_TABLET | Freq: Four times a day (QID) | ORAL | Status: DC | PRN

## 2022-06-08 MED ORDER — DILTIAZEM HCL 30 MG PO TABS
30.0000 mg | ORAL_TABLET | Freq: Two times a day (BID) | ORAL | Status: DC
  Filled 2022-06-08: qty 1

## 2022-06-08 MED ORDER — LIDOCAINE 5 % EX PTCH
1.0000 | MEDICATED_PATCH | CUTANEOUS | Status: DC
  Administered 2022-06-08 – 2022-06-10 (×3): 1 via TRANSDERMAL
  Filled 2022-06-08 (×3): qty 1

## 2022-06-08 MED ORDER — DILTIAZEM HCL 60 MG PO TABS
30.0000 mg | ORAL_TABLET | Freq: Two times a day (BID) | ORAL | Status: DC
  Administered 2022-06-08 – 2022-06-11 (×6): 30 mg via ORAL
  Filled 2022-06-08 (×7): qty 1

## 2022-06-08 MED ORDER — FENTANYL CITRATE PF 50 MCG/ML IJ SOSY
12.5000 ug | PREFILLED_SYRINGE | INTRAMUSCULAR | Status: DC | PRN
  Administered 2022-06-08 – 2022-06-09 (×3): 12.5 ug via INTRAVENOUS
  Filled 2022-06-08 (×4): qty 1

## 2022-06-08 MED ORDER — FENTANYL CITRATE PF 50 MCG/ML IJ SOSY
50.0000 ug | PREFILLED_SYRINGE | Freq: Once | INTRAMUSCULAR | Status: AC
  Administered 2022-06-08: 50 ug via INTRAVENOUS
  Filled 2022-06-08: qty 1

## 2022-06-08 MED ORDER — SODIUM CHLORIDE 0.9 % IV BOLUS
1000.0000 mL | Freq: Once | INTRAVENOUS | Status: AC
  Administered 2022-06-08: 1000 mL via INTRAVENOUS

## 2022-06-08 MED ORDER — IOHEXOL 350 MG/ML SOLN
100.0000 mL | Freq: Once | INTRAVENOUS | Status: AC | PRN
  Administered 2022-06-08: 75 mL via INTRAVENOUS

## 2022-06-08 MED ORDER — PANTOPRAZOLE SODIUM 40 MG IV SOLR
40.0000 mg | Freq: Every day | INTRAVENOUS | Status: DC
  Filled 2022-06-08 (×3): qty 10

## 2022-06-08 MED ORDER — METOPROLOL TARTRATE 5 MG/5ML IV SOLN
10.0000 mg | Freq: Four times a day (QID) | INTRAVENOUS | Status: DC
  Administered 2022-06-09 (×2): 10 mg via INTRAVENOUS
  Filled 2022-06-08 (×2): qty 10

## 2022-06-08 MED ORDER — SODIUM CHLORIDE 0.45 % IV SOLN: INTRAVENOUS | Status: DC

## 2022-06-08 MED ORDER — LEVOTHYROXINE SODIUM 75 MCG PO TABS
75.0000 ug | ORAL_TABLET | Freq: Every day | ORAL | Status: DC
  Administered 2022-06-10 – 2022-06-11 (×2): 75 ug via ORAL
  Filled 2022-06-08 (×2): qty 1

## 2022-06-08 MED ORDER — HYDRALAZINE HCL 20 MG/ML IJ SOLN: 10.0000 mg | INTRAMUSCULAR | Status: DC | PRN

## 2022-06-08 MED ORDER — ACETAMINOPHEN 500 MG PO TABS
1000.0000 mg | ORAL_TABLET | Freq: Four times a day (QID) | ORAL | Status: DC
  Administered 2022-06-08 – 2022-06-11 (×10): 1000 mg via ORAL
  Filled 2022-06-08 (×9): qty 2

## 2022-06-08 MED ORDER — PANTOPRAZOLE SODIUM 40 MG PO TBEC
40.0000 mg | DELAYED_RELEASE_TABLET | Freq: Every day | ORAL | Status: DC
  Administered 2022-06-08 – 2022-06-11 (×4): 40 mg via ORAL
  Filled 2022-06-08 (×4): qty 1

## 2022-06-08 MED ORDER — METHOCARBAMOL 500 MG PO TABS
500.0000 mg | ORAL_TABLET | Freq: Three times a day (TID) | ORAL | Status: DC | PRN
  Administered 2022-06-08 – 2022-06-10 (×3): 500 mg via ORAL
  Filled 2022-06-08 (×3): qty 1

## 2022-06-08 MED ORDER — TRAMADOL HCL 50 MG PO TABS
50.0000 mg | ORAL_TABLET | Freq: Four times a day (QID) | ORAL | Status: DC | PRN
  Administered 2022-06-08 – 2022-06-09 (×3): 50 mg via ORAL
  Filled 2022-06-08 (×4): qty 1

## 2022-06-08 MED ORDER — SENNOSIDES-DOCUSATE SODIUM 8.6-50 MG PO TABS
1.0000 | ORAL_TABLET | Freq: Two times a day (BID) | ORAL | Status: DC
  Administered 2022-06-08 – 2022-06-11 (×5): 1 via ORAL
  Filled 2022-06-08 (×6): qty 1

## 2022-06-08 MED ORDER — METOPROLOL TARTRATE 5 MG/5ML IV SOLN
5.0000 mg | INTRAVENOUS | Status: DC | PRN
  Administered 2022-06-09: 5 mg via INTRAVENOUS
  Filled 2022-06-08: qty 5

## 2022-06-08 MED ORDER — ENOXAPARIN SODIUM 30 MG/0.3ML IJ SOSY
30.0000 mg | PREFILLED_SYRINGE | INTRAMUSCULAR | Status: DC
  Administered 2022-06-09 – 2022-06-11 (×3): 30 mg via SUBCUTANEOUS
  Filled 2022-06-08 (×3): qty 0.3

## 2022-06-08 MED ORDER — ONDANSETRON HCL 4 MG/2ML IJ SOLN
4.0000 mg | Freq: Four times a day (QID) | INTRAMUSCULAR | Status: DC | PRN
  Administered 2022-06-08 – 2022-06-10 (×2): 4 mg via INTRAVENOUS
  Filled 2022-06-08 (×2): qty 2
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
## 2022-06-08 NOTE — ED Notes (Signed)
Sara Hines pt daughter [phone]
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
## 2022-06-08 NOTE — ED Notes (Signed)
IV documented and labs collected under [facility].  These were performed by [facility] N.-RN

## 2022-06-08 NOTE — ED Notes (Signed)
Trauma Event Note  TRN to bedside to round. Pt alert and appropriate, speaking with grandson at bedside. Placed on a hospital bed for comfort. Assisted pt with commode - unable to use purewick. Pt's HR up to 150s and was very fatigued and nauseated after voiding, advised primary RN to utilize bedpan for the night. [employer]. IS provided.  Last imported Vital Signs BP (!) 150/93   Pulse (!) 107   Temp 98.3 F (36.8 C) (Oral)   Resp 18   Ht '5\' 6"'$  (1.676 m)   Wt 108 lb (49 kg)   SpO2 99%   BMI 17.43 kg/m   Trending CBC Recent Labs    06/14/2022 1431 06/28/2022 1526  WBC 13.7*  --   HGB 10.9* 10.9*  HCT 33.6* 32.0*  PLT 186  --     Trending Coag's Recent Labs    06/18/2022 1431  APTT 26  INR 1.1    Trending BMET Recent Labs    07/02/2022 1431 06/14/2022 1526  NA 138 138  K 4.8 4.7  CL 106 107  CO2 21*  --   BUN 24* 27*  CREATININE 1.85* 2.00*  GLUCOSE 136* 133*      Dayra Rapley O Traycen Goyer  Trauma Response RN  Please call TRN at [phone] for further assistance.
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
## 2022-06-08 NOTE — ED Notes (Signed)
Purewick placed at this time. Patient's sister at bedside with patient's permission reports she will be taking the patient's valuables home with her
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
## 2022-06-09 ENCOUNTER — Inpatient Hospital Stay (HOSPITAL_COMMUNITY): Payer: Medicare Other

## 2022-06-09 ENCOUNTER — Encounter (HOSPITAL_COMMUNITY): Payer: Self-pay

## 2022-06-09 DIAGNOSIS — I4821 Permanent atrial fibrillation: Secondary | ICD-10-CM

## 2022-06-09 DIAGNOSIS — R7989 Other specified abnormal findings of blood chemistry: Secondary | ICD-10-CM | POA: Diagnosis not present

## 2022-06-09 DIAGNOSIS — I4891 Unspecified atrial fibrillation: Secondary | ICD-10-CM | POA: Diagnosis not present

## 2022-06-09 LAB — BASIC METABOLIC PANEL
Anion gap: 9 (ref 5–15)
Anion gap: 9 (ref 5–15)
BUN: 24 mg/dL — ABNORMAL HIGH (ref 8–23)
BUN: 25 mg/dL — ABNORMAL HIGH (ref 8–23)
CO2: 21 mmol/L — ABNORMAL LOW (ref 22–32)
CO2: 22 mmol/L (ref 22–32)
Calcium: 9.1 mg/dL (ref 8.9–10.3)
Calcium: 9.1 mg/dL (ref 8.9–10.3)
Chloride: 106 mmol/L (ref 98–111)
Chloride: 106 mmol/L (ref 98–111)
Creatinine, Ser: 1.64 mg/dL — ABNORMAL HIGH (ref 0.44–1.00)
Creatinine, Ser: 1.59 mg/dL — ABNORMAL HIGH (ref 0.44–1.00)
GFR, Estimated: 31 mL/min — ABNORMAL LOW (ref >60)
GFR, Estimated: 32 mL/min — ABNORMAL LOW (ref >60)
Glucose, Bld: 147 mg/dL — ABNORMAL HIGH (ref 70–99)
Glucose, Bld: 118 mg/dL — ABNORMAL HIGH (ref 70–99)
Potassium: 5.4 mmol/L — ABNORMAL HIGH (ref 3.5–5.1)
Potassium: 4.8 mmol/L (ref 3.5–5.1)
Sodium: 136 mmol/L (ref 135–145)
Sodium: 137 mmol/L (ref 135–145)

## 2022-06-09 LAB — CBC
HCT: 27.2 % — ABNORMAL LOW (ref 36.0–46.0)
Hemoglobin: 9 g/dL — ABNORMAL LOW (ref 12.0–15.0)
MCH: 34.7 pg — ABNORMAL HIGH (ref 26.0–34.0)
MCHC: 33.1 g/dL (ref 30.0–36.0)
MCV: 105 fL — ABNORMAL HIGH (ref 80.0–100.0)
Platelets: 150 10*3/uL (ref 150–400)
RBC: 2.59 MIL/uL — ABNORMAL LOW (ref 3.87–5.11)
RDW: 14.6 % (ref 11.5–15.5)
WBC: 12.7 10*3/uL — ABNORMAL HIGH (ref 4.0–10.5)
nRBC: 0 % (ref 0.0–0.2)

## 2022-06-09 LAB — URINALYSIS, ROUTINE W REFLEX MICROSCOPIC
Bilirubin Urine: NEGATIVE
Glucose, UA: NEGATIVE mg/dL
Hgb urine dipstick: NEGATIVE
Ketones, ur: NEGATIVE mg/dL
Nitrite: NEGATIVE
Protein, ur: 30 mg/dL — AB
Specific Gravity, Urine: 1.04 — ABNORMAL HIGH (ref 1.005–1.030)
WBC, UA: >50 WBC/hpf — ABNORMAL HIGH (ref 0–5)
pH: 6 (ref 5.0–8.0)

## 2022-06-09 LAB — TROPONIN I (HIGH SENSITIVITY): Troponin I (High Sensitivity): 600 ng/L (ref <18)

## 2022-06-09 LAB — ECHOCARDIOGRAM COMPLETE
Height: 66 in
MV M vel: 5.65 m/s
MV Peak grad: 127.7 mmHg
Radius: 0.3 cm
S' Lateral: 2.8 cm
Weight: 1728 oz

## 2022-06-09 MED ORDER — ALBUTEROL SULFATE (2.5 MG/3ML) 0.083% IN NEBU
2.5000 mg | INHALATION_SOLUTION | Freq: Four times a day (QID) | RESPIRATORY_TRACT | Status: DC
  Administered 2022-06-09 – 2022-06-10 (×3): 2.5 mg via RESPIRATORY_TRACT
  Filled 2022-06-09 (×3): qty 3

## 2022-06-09 MED ORDER — METOPROLOL TARTRATE 25 MG PO TABS
25.0000 mg | ORAL_TABLET | Freq: Two times a day (BID) | ORAL | Status: DC
  Administered 2022-06-09 – 2022-06-11 (×5): 25 mg via ORAL
  Filled 2022-06-09 (×6): qty 1
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
## 2022-06-09 NOTE — Progress Notes (Signed)
Orthopedic Tech Progress Note Patient Details:  JENIKA CHIEM 02-May-1935 [id number]  Called in order to [employer] for a CTO   Patient ID: Sondra Barges, female   DOB: 1934/11/12, 87 y.o.   MRN: [id number]  Janit Pagan 06/09/2022, 11:19 AM

## 2022-06-10 ENCOUNTER — Telehealth (HOSPITAL_COMMUNITY): Payer: Self-pay

## 2022-06-10 ENCOUNTER — Inpatient Hospital Stay (HOSPITAL_COMMUNITY): Payer: Medicare Other

## 2022-06-10 ENCOUNTER — Other Ambulatory Visit (HOSPITAL_COMMUNITY): Payer: Self-pay

## 2022-06-10 DIAGNOSIS — I2489 Other forms of acute ischemic heart disease: Secondary | ICD-10-CM

## 2022-06-10 DIAGNOSIS — I4821 Permanent atrial fibrillation: Secondary | ICD-10-CM | POA: Diagnosis not present

## 2022-06-10 DIAGNOSIS — R7989 Other specified abnormal findings of blood chemistry: Secondary | ICD-10-CM | POA: Diagnosis not present

## 2022-06-10 LAB — BASIC METABOLIC PANEL
Anion gap: 11 (ref 5–15)
BUN: 27 mg/dL — ABNORMAL HIGH (ref 8–23)
CO2: 15 mmol/L — ABNORMAL LOW (ref 22–32)
Calcium: 8.7 mg/dL — ABNORMAL LOW (ref 8.9–10.3)
Chloride: 107 mmol/L (ref 98–111)
Creatinine, Ser: 1.45 mg/dL — ABNORMAL HIGH (ref 0.44–1.00)
GFR, Estimated: 36 mL/min — ABNORMAL LOW (ref >60)
Glucose, Bld: 119 mg/dL — ABNORMAL HIGH (ref 70–99)
Potassium: 4.8 mmol/L (ref 3.5–5.1)
Sodium: 133 mmol/L — ABNORMAL LOW (ref 135–145)

## 2022-06-10 LAB — BLOOD GAS, ARTERIAL
Acid-base deficit: 3.3 mmol/L — ABNORMAL HIGH (ref 0.0–2.0)
Acid-base deficit: 4.5 mmol/L — ABNORMAL HIGH (ref 0.0–2.0)
Bicarbonate: 21.3 mmol/L (ref 20.0–28.0)
Bicarbonate: 19.2 mmol/L — ABNORMAL LOW (ref 20.0–28.0)
Drawn by: 345601
Drawn by: 280991
O2 Saturation: 91.1 %
O2 Saturation: 99.1 %
Patient temperature: 36.7
Patient temperature: 36.9
pCO2 arterial: 36 mmHg (ref 32–48)
pCO2 arterial: 31 mmHg — ABNORMAL LOW (ref 32–48)
pH, Arterial: 7.38 (ref 7.35–7.45)
pH, Arterial: 7.4 (ref 7.35–7.45)
pO2, Arterial: 60 mmHg — ABNORMAL LOW (ref 83–108)
pO2, Arterial: 127 mmHg — ABNORMAL HIGH (ref 83–108)

## 2022-06-10 LAB — CBC
HCT: 23.8 % — ABNORMAL LOW (ref 36.0–46.0)
Hemoglobin: 7.8 g/dL — ABNORMAL LOW (ref 12.0–15.0)
MCH: 34.5 pg — ABNORMAL HIGH (ref 26.0–34.0)
MCHC: 32.8 g/dL (ref 30.0–36.0)
MCV: 105.3 fL — ABNORMAL HIGH (ref 80.0–100.0)
Platelets: 120 10*3/uL — ABNORMAL LOW (ref 150–400)
RBC: 2.26 MIL/uL — ABNORMAL LOW (ref 3.87–5.11)
RDW: 14.7 % (ref 11.5–15.5)
WBC: 13.9 10*3/uL — ABNORMAL HIGH (ref 4.0–10.5)
nRBC: 0 % (ref 0.0–0.2)

## 2022-06-10 MED ORDER — NICOTINE 21 MG/24HR TD PT24
21.0000 mg | MEDICATED_PATCH | Freq: Every day | TRANSDERMAL | Status: DC
  Administered 2022-06-10 – 2022-06-11 (×2): 21 mg via TRANSDERMAL
  Filled 2022-06-10 (×2): qty 1

## 2022-06-10 MED ORDER — CHLORHEXIDINE GLUCONATE CLOTH 2 % EX PADS
6.0000 | MEDICATED_PAD | Freq: Every day | CUTANEOUS | Status: DC
  Administered 2022-06-11: 6 via TOPICAL

## 2022-06-10 MED ORDER — ALBUTEROL SULFATE (2.5 MG/3ML) 0.083% IN NEBU
2.5000 mg | INHALATION_SOLUTION | RESPIRATORY_TRACT | Status: DC
  Administered 2022-06-10 – 2022-06-11 (×7): 2.5 mg via RESPIRATORY_TRACT
  Filled 2022-06-10 (×8): qty 3

## 2022-06-10 MED ORDER — FUROSEMIDE 10 MG/ML IJ SOLN
20.0000 mg | Freq: Once | INTRAMUSCULAR | Status: AC
  Administered 2022-06-10: 20 mg via INTRAVENOUS
  Filled 2022-06-10: qty 2

## 2022-06-10 MED ORDER — ENSURE MAX PROTEIN PO LIQD
11.0000 [oz_av] | Freq: Three times a day (TID) | ORAL | Status: DC
  Administered 2022-06-10 – 2022-06-11 (×2): 11 [oz_av] via ORAL
  Filled 2022-06-10 (×5): qty 330

## 2022-06-10 NOTE — Progress Notes (Signed)
Pt transitioned to [place] 35L/100% so pt could eat. RN made aware.

## 2022-06-10 NOTE — Telephone Encounter (Signed)
Pharmacy Patient Advocate Encounter  Insurance verification completed.    The patient is insured through [employer] Part D   The patient is currently admitted and ran test claims for the following: Xarelto '20mg'$  and Eliquis '5mg'$ .  Copays and coinsurance results were relayed to Inpatient clinical team.
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
## 2022-06-10 NOTE — Progress Notes (Signed)
RT attempted to do Metaneb, but pt would not cooperate and kept pushing the mask off her face. Treatment was stopped.

## 2022-06-10 NOTE — Progress Notes (Addendum)
Patient seen and examined. Hypoxia to 70s, slow recovery to 92% with NRB after CT chest. CT reviewed, mostly parenchymal, I do not think thoracostomy will provide significant clinical benefit. Lasix admin. Chest PT ordered. I believe intubation is indicated, but patient is currently declining this intervention "I hoped it wouldn't come to this." Patient declines trach and PEG. She is agreeable to chest compressions, defibrillation, vasopressors/anti-arrhythmics. Will transfer patient to ICU\. Attempted to notify family of her decision; I called multiple family members and either no answer or voicemail full.  Critical care time: 64mn  AJesusita Oka MD [facility] and [facility]

## 2022-06-10 NOTE — Progress Notes (Signed)
OT Cancellation Note  Patient Details Name: Sara Hines MRN: [id number] DOB: 1934-12-10   Cancelled Treatment:    Reason Eval/Treat Not Completed: Medical issues which prohibited therapy (Pt with respiratory issues, now on Venturi Mask, nsg asking to hold. Will follow up later date.)  [facility] 06/10/2022, 11:03 AM Maurie Boettcher, OT/L   Acute OT Clinical Specialist [place] Pager [phone] Office [phone]

## 2022-06-10 NOTE — Progress Notes (Signed)
PT Cancellation Note  Patient Details Name: Sara Hines MRN: [id number] DOB: April 06, 1935   Cancelled Treatment:    Reason Eval/Treat Not Completed: Medical issues which prohibited therapy this morning due to respiratory issues. Pt now on now on Venturi Mask and RN asked for PT to hold at this time. Will continue to follow and progress mobility as able.   [place], PT, DPT   Acute Rehabilitation Department   Sara Hines 06/10/2022, 11:08 AM
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
## 2022-06-10 NOTE — Progress Notes (Signed)
Patient stated she did not want to do metaneb right now. ALB neb treatment given.

## 2022-06-10 NOTE — TOC Benefit Eligibility Note (Signed)
Patient Teacher, English as a foreign language completed.    The current 30 day co-pay is, $482.00 for Eliquis '5mg'$  and Xarelto '20mg'$ . The copay includes a deductible.   The patient is insured through [place], [place] Patient Advocate Specialist [place] Patient Advocate Team Direct Number: [phone]  Fax: [phone]

## 2022-06-10 NOTE — Plan of Care (Signed)

## 2022-06-10 NOTE — Progress Notes (Signed)
RT transported patient from 4N to 16M without any complications.

## 2022-06-10 NOTE — Progress Notes (Addendum)
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 scheduled PO metoprolol, home cardizem. Cards consulted 10/4. Echo w/ LVEF 50% mildly decreased LV fxn. At baseline not on anticoagulation, cardiology recommending DOAC once cleared from trauma perspective. Consider event monitor at discharge. HTN - home meds HLD  Hypothyroidism - home synthroid GERD - protonix Remote h/o breast cancer s/p right mastectomy   ID - none; Urine Cx pending.  VTE - SCDs, lovenox FEN - 1/2 NS at 75 mL/hr, Soft diet.  Foley - none   Dispo - progressive. Aggressive pulm toilet, will discuss benefit of repeat chest CT w MD, monitor hgb. PT/OT.    LOS: 2 days   I reviewed nursing notes, last 24 h vitals and pain scores, last 48 h intake and output, last 24 h labs and trends, and last 24 h imaging results.  This care required moderate level of medical decision making.   Obie Dredge, PA-C [facility] Please see Amion for pager number during day hours 7:00am-4:30pm

## 2022-06-10 NOTE — Progress Notes (Signed)
Patient ID: Sondra Barges, female   DOB: 1935-07-06, 87 y.o.   MRN: [id number] She is not complaining of interscapular pain today.  She is moving her legs well.  She was mobilized in her brace yesterday.  May have head of bed 30 degrees without the brace.  Should have the brace whenever she is out of bed.  We will follow.

## 2022-06-10 NOTE — Progress Notes (Signed)
Patient examined this afternoon. Sister is present at bedside and assisting patient with eating. Patient is now on HFNC and is breathing comfortably, O2 sats 98%. Continue to monitor.

## 2022-06-11 ENCOUNTER — Inpatient Hospital Stay: Payer: Self-pay

## 2022-06-11 ENCOUNTER — Inpatient Hospital Stay (HOSPITAL_COMMUNITY): Payer: Medicare Other

## 2022-06-11 DIAGNOSIS — R7989 Other specified abnormal findings of blood chemistry: Secondary | ICD-10-CM | POA: Diagnosis not present

## 2022-06-11 DIAGNOSIS — I4821 Permanent atrial fibrillation: Secondary | ICD-10-CM | POA: Diagnosis not present

## 2022-06-11 DIAGNOSIS — I2489 Other forms of acute ischemic heart disease: Secondary | ICD-10-CM | POA: Diagnosis not present

## 2022-06-11 LAB — GLUCOSE, CAPILLARY
Glucose-Capillary: 156 mg/dL — ABNORMAL HIGH (ref 70–99)
Glucose-Capillary: 117 mg/dL — ABNORMAL HIGH (ref 70–99)
Glucose-Capillary: 147 mg/dL — ABNORMAL HIGH (ref 70–99)
Glucose-Capillary: 150 mg/dL — ABNORMAL HIGH (ref 70–99)

## 2022-06-11 LAB — POCT I-STAT 7, (LYTES, BLD GAS, ICA,H+H)
Acid-base deficit: 7 mmol/L — ABNORMAL HIGH (ref 0.0–2.0)
Bicarbonate: 19.1 mmol/L — ABNORMAL LOW (ref 20.0–28.0)
Calcium, Ion: 1.28 mmol/L (ref 1.15–1.40)
HCT: 28 % — ABNORMAL LOW (ref 36.0–46.0)
Hemoglobin: 9.5 g/dL — ABNORMAL LOW (ref 12.0–15.0)
O2 Saturation: 95 %
Patient temperature: 98.6
Potassium: 4.4 mmol/L (ref 3.5–5.1)
Sodium: 131 mmol/L — ABNORMAL LOW (ref 135–145)
TCO2: 20 mmol/L — ABNORMAL LOW (ref 22–32)
pCO2 arterial: 37.6 mmHg (ref 32–48)
pH, Arterial: 7.315 — ABNORMAL LOW (ref 7.35–7.45)
pO2, Arterial: 81 mmHg — ABNORMAL LOW (ref 83–108)

## 2022-06-11 LAB — BASIC METABOLIC PANEL
Anion gap: 14 (ref 5–15)
BUN: 32 mg/dL — ABNORMAL HIGH (ref 8–23)
CO2: 16 mmol/L — ABNORMAL LOW (ref 22–32)
Calcium: 8.9 mg/dL (ref 8.9–10.3)
Chloride: 103 mmol/L (ref 98–111)
Creatinine, Ser: 1.44 mg/dL — ABNORMAL HIGH (ref 0.44–1.00)
GFR, Estimated: 36 mL/min — ABNORMAL LOW (ref >60)
Glucose, Bld: 133 mg/dL — ABNORMAL HIGH (ref 70–99)
Potassium: 3.8 mmol/L (ref 3.5–5.1)
Sodium: 133 mmol/L — ABNORMAL LOW (ref 135–145)

## 2022-06-11 LAB — CBC
HCT: 21.8 % — ABNORMAL LOW (ref 36.0–46.0)
Hemoglobin: 7.3 g/dL — ABNORMAL LOW (ref 12.0–15.0)
MCH: 34.8 pg — ABNORMAL HIGH (ref 26.0–34.0)
MCHC: 33.5 g/dL (ref 30.0–36.0)
MCV: 103.8 fL — ABNORMAL HIGH (ref 80.0–100.0)
Platelets: 104 10*3/uL — ABNORMAL LOW (ref 150–400)
RBC: 2.1 MIL/uL — ABNORMAL LOW (ref 3.87–5.11)
RDW: 14.4 % (ref 11.5–15.5)
WBC: 13.7 10*3/uL — ABNORMAL HIGH (ref 4.0–10.5)
nRBC: 0.3 % — ABNORMAL HIGH (ref 0.0–0.2)

## 2022-06-11 LAB — MRSA NEXT GEN BY PCR, NASAL: MRSA by PCR Next Gen: NOT DETECTED

## 2022-06-11 MED ORDER — GLYCOPYRROLATE 0.2 MG/ML IJ SOLN: 0.2000 mg | INTRAMUSCULAR | Status: DC | PRN

## 2022-06-11 MED ORDER — OSMOLITE 1.2 CAL PO LIQD
1000.0000 mL | ORAL | Status: DC
  Filled 2022-06-11: qty 1000

## 2022-06-11 MED ORDER — SODIUM CHLORIDE 0.9 % IV SOLN
250.0000 mL | INTRAVENOUS | Status: DC
  Administered 2022-06-11: 250 mL via INTRAVENOUS

## 2022-06-11 MED ORDER — PHENYLEPHRINE 80 MCG/ML (10ML) SYRINGE FOR IV PUSH (FOR BLOOD PRESSURE SUPPORT)
80.0000 ug | PREFILLED_SYRINGE | Freq: Once | INTRAVENOUS | Status: AC | PRN
  Administered 2022-06-11: 80 ug via INTRAVENOUS
  Filled 2022-06-11: qty 10

## 2022-06-11 MED ORDER — FENTANYL CITRATE PF 50 MCG/ML IJ SOSY
50.0000 ug | PREFILLED_SYRINGE | Freq: Once | INTRAMUSCULAR | Status: AC
  Administered 2022-06-11: 50 ug via INTRAVENOUS

## 2022-06-11 MED ORDER — FENTANYL BOLUS VIA INFUSION
25.0000 ug | INTRAVENOUS | Status: DC | PRN
  Administered 2022-06-11 (×2): 50 ug via INTRAVENOUS

## 2022-06-11 MED ORDER — HALOPERIDOL 0.5 MG PO TABS: 0.5000 mg | ORAL_TABLET | ORAL | Status: DC | PRN

## 2022-06-11 MED ORDER — MORPHINE BOLUS VIA INFUSION: 1.0000 mg | INTRAVENOUS | Status: DC | PRN

## 2022-06-11 MED ORDER — MIDAZOLAM HCL 2 MG/2ML IJ SOLN
1.0000 mg | Freq: Once | INTRAMUSCULAR | Status: AC
  Administered 2022-06-11: 1 mg via INTRAVENOUS
  Filled 2022-06-11: qty 2

## 2022-06-11 MED ORDER — ACETAMINOPHEN 500 MG PO TABS: 1000.0000 mg | ORAL_TABLET | Freq: Four times a day (QID) | ORAL | Status: DC

## 2022-06-11 MED ORDER — FUROSEMIDE 10 MG/ML IJ SOLN: 20.0000 mg | Freq: Once | INTRAMUSCULAR | Status: DC

## 2022-06-11 MED ORDER — HALOPERIDOL LACTATE 5 MG/ML IJ SOLN
INTRAMUSCULAR | Status: AC
  Administered 2022-06-11: 2 mg via INTRAVENOUS
  Filled 2022-06-11: qty 1

## 2022-06-11 MED ORDER — HALOPERIDOL LACTATE 2 MG/ML PO CONC: 0.5000 mg | ORAL | Status: DC | PRN

## 2022-06-11 MED ORDER — SODIUM CHLORIDE 0.9 % IV SOLN: 12.5000 mg | Freq: Four times a day (QID) | INTRAVENOUS | Status: DC | PRN

## 2022-06-11 MED ORDER — POLYVINYL ALCOHOL 1.4 % OP SOLN: 1.0000 [drp] | Freq: Four times a day (QID) | OPHTHALMIC | Status: DC | PRN

## 2022-06-11 MED ORDER — NOREPINEPHRINE 4 MG/250ML-% IV SOLN
2.0000 ug/min | INTRAVENOUS | Status: DC
  Administered 2022-06-11: 2 ug/min via INTRAVENOUS
  Filled 2022-06-11: qty 250

## 2022-06-11 MED ORDER — SODIUM CHLORIDE 0.9 % IV SOLN
1.0000 g | INTRAVENOUS | Status: DC
  Administered 2022-06-11: 1 g via INTRAVENOUS
  Filled 2022-06-11: qty 10

## 2022-06-11 MED ORDER — DOCUSATE SODIUM 50 MG/5ML PO LIQD: 100.0000 mg | Freq: Two times a day (BID) | ORAL | Status: DC

## 2022-06-11 MED ORDER — SUCCINYLCHOLINE CHLORIDE 200 MG/10ML IV SOSY
50.0000 mg | PREFILLED_SYRINGE | Freq: Once | INTRAVENOUS | Status: AC
  Administered 2022-06-11: 50 mg via INTRAVENOUS
  Filled 2022-06-11: qty 10

## 2022-06-11 MED ORDER — FENTANYL 2500MCG IN NS 250ML (10MCG/ML) PREMIX INFUSION
50.0000 ug/h | INTRAVENOUS | Status: DC
  Administered 2022-06-11: 50 ug/h via INTRAVENOUS
  Filled 2022-06-11: qty 250

## 2022-06-11 MED ORDER — ORAL CARE MOUTH RINSE: 15.0000 mL | OROMUCOSAL | Status: DC | PRN

## 2022-06-11 MED ORDER — METOPROLOL TARTRATE 25 MG PO TABS: 25.0000 mg | ORAL_TABLET | Freq: Two times a day (BID) | ORAL | Status: DC

## 2022-06-11 MED ORDER — ONDANSETRON 4 MG PO TBDP: 4.0000 mg | ORAL_TABLET | Freq: Four times a day (QID) | ORAL | Status: DC | PRN

## 2022-06-11 MED ORDER — NITROFURANTOIN MONOHYD MACRO 100 MG PO CAPS: 100.0000 mg | ORAL_CAPSULE | Freq: Two times a day (BID) | ORAL | Status: DC

## 2022-06-11 MED ORDER — MORPHINE 100MG IN NS 100ML (1MG/ML) PREMIX INFUSION
1.0000 mg/h | INTRAVENOUS | Status: DC
  Administered 2022-06-11: 1 mg/h via INTRAVENOUS
  Filled 2022-06-11: qty 100

## 2022-06-11 MED ORDER — HALOPERIDOL LACTATE 5 MG/ML IJ SOLN: 0.5000 mg | INTRAMUSCULAR | Status: DC | PRN

## 2022-06-11 MED ORDER — ONDANSETRON HCL 4 MG/2ML IJ SOLN: 4.0000 mg | Freq: Four times a day (QID) | INTRAMUSCULAR | Status: DC | PRN

## 2022-06-11 MED ORDER — VITAL HIGH PROTEIN PO LIQD: 1000.0000 mL | ORAL | Status: DC

## 2022-06-11 MED ORDER — BIOTENE DRY MOUTH MT LIQD: 15.0000 mL | OROMUCOSAL | Status: DC | PRN

## 2022-06-11 MED ORDER — HALOPERIDOL LACTATE 5 MG/ML IJ SOLN: 2.0000 mg | Freq: Four times a day (QID) | INTRAMUSCULAR | Status: DC | PRN

## 2022-06-11 MED ORDER — GLYCOPYRROLATE 1 MG PO TABS: 1.0000 mg | ORAL_TABLET | ORAL | Status: DC | PRN

## 2022-06-11 MED ORDER — POLYETHYLENE GLYCOL 3350 17 G PO PACK: 17.0000 g | PACK | Freq: Every day | ORAL | Status: DC

## 2022-06-11 MED ORDER — PROSOURCE TF20 ENFIT COMPATIBL EN LIQD: 60.0000 mL | Freq: Every day | ENTERAL | Status: DC

## 2022-06-11 MED ORDER — ORAL CARE MOUTH RINSE
15.0000 mL | OROMUCOSAL | Status: DC
  Administered 2022-06-11: 15 mL via OROMUCOSAL

## 2022-06-11 MED ORDER — LEVOTHYROXINE SODIUM 75 MCG PO TABS: 75.0000 ug | ORAL_TABLET | Freq: Every day | ORAL | Status: DC

## 2022-06-11 MED ORDER — FENTANYL CITRATE PF 50 MCG/ML IJ SOSY
50.0000 ug | PREFILLED_SYRINGE | Freq: Once | INTRAMUSCULAR | Status: DC
  Filled 2022-06-11: qty 1

## 2022-06-11 MED ORDER — FENTANYL CITRATE PF 50 MCG/ML IJ SOSY: 50.0000 ug | PREFILLED_SYRINGE | Freq: Once | INTRAMUSCULAR | Status: DC

## 2022-06-11 MED ORDER — PANTOPRAZOLE 2 MG/ML SUSPENSION: 40.0000 mg | Freq: Every day | ORAL | Status: DC

## 2022-06-11 MED ORDER — DILTIAZEM HCL 60 MG PO TABS: 30.0000 mg | ORAL_TABLET | Freq: Two times a day (BID) | ORAL | Status: DC

## 2022-06-12 LAB — URINE CULTURE: Culture: >=100000 — AB

## 2022-07-07 NOTE — Progress Notes (Signed)
Notified of patient desaturation to 70s, refractory to supplemental O2. Patient seen and examined. Continues to refuse intubation. Daughter at bedside, requesting intubation. Conversation held with the patient's daughter regarding respecting and honoring the patient's wishes and daughter verbalizes understanding. Aggressive respiratory support measures implemented and sats improved to mid-80s. I asked the patient if she would be willing to discuss her desire not to be intubated with her daughter, as this seemed to be the daughter's first time hearing this and the patient verbalized yes. I stepped out of the room to perform documentation during their conversation and was shortly thereafter notified that the patient is now agreeable to intubation so that the daughter will not be upset. I contacted palliative care/ethics for additional guidance and consult note entered. I discussed again with the patient in the presence of the patient's daughter and she again refused intubation. Daughter then verbalized that the patient was agreeable to intubation during their conversation. Patient then states agreement to intubation. I had the discussion with the patient about trach/PEG if indicated and patient firmly refuses in the presence of her daughter, which is consistent with her decision in our discussion yesterday. Will proceed with intubation per patient request.   Critical care time: 24mn  AJesusita Oka MD [facility] and [facility]
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
## 2022-07-07 NOTE — Progress Notes (Signed)
Order for Foley catherer was placed by Dr. Bobbye Morton. Patient refused indwelling catheter at this time. Dr Bobbye Morton aware and has spoken with patient regarding indwelling catheter.

## 2022-07-07 NOTE — Procedures (Addendum)
Intubation Procedure Note  Sara Hines  [id number]  June 13, 1935  Date:2022/06/18  Time:3:11 PM   Provider Performing: Jesusita Oka    Procedure: Intubation (31500)  Indication(s) Respiratory Failure  Consent Risks of the procedure as well as the alternatives and risks of each were explained to the patient and/or caregiver.  Consent for the procedure was obtained and is signed in the bedside chart   Anesthesia Versed, Fentanyl, and Succinylcholine  '1mg'$ , 40mg, '50mg'$    Time Out Verified patient identification, verified procedure, site/side was marked, verified correct patient position, special equipment/implants available, medications/allergies/relevant history reviewed, required imaging and test results available.   Sterile Technique Usual hand hygeine, masks, and gloves were used   Procedure Description Patient positioned in bed supine.  Sedation given as noted above.  Patient was intubated with endotracheal tube using Glidescope.  View was Grade 1 full glottis .  Number of attempts was 1.  Colorimetric CO2 detector was consistent with tracheal placement.  Size 3 blade, 7.0 tube, 231RXat lip   Complications/Tolerance None; patient tolerated the procedure well. Chest X-ray is ordered to verify placement.   EBL none   Specimen(s) None

## 2022-07-07 NOTE — Procedures (Signed)
Extubation Procedure Note  Patient Details:   Name: Sara Hines DOB: Nov 10, 1934 MRN: [id number]   Airway Documentation:    Vent end date: Jun 29, 2022 Vent end time: 1850   Evaluation  O2 sats: currently acceptable Complications: No apparent complications Patient did tolerate procedure well. Bilateral Breath Sounds: Diminished   No  Gonzella Lex Jun 29, 2022, 6:59 PM
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
## 2022-07-07 NOTE — Progress Notes (Signed)
Family meeting held with all four children, patient's sister, and two others for support. Discussion held regarding patient's initial declination of intubation yesterday and continued declinations today, as well as ultimate decision to proceed with intubation. Family present is very vocal that intubation was not in line with her wishes based on their prior discussions with her. We discussed worsening of her pulmonary status both clinically and radiographically. I offered my opinion that the significant atelectasis of her lungs radiographically indicate a high risk of development of pneumonia. We discussed the typical time frame of intubation being 10-14d and that I do not expect her rib fractures to meaningfully heal in that time frame. I do not think she is a candidate for surgical rib fixation. Family has discussed amongst themselves and would like to proceed with compassionate extubation as they believe this would be in line with the patient's wishes.   Critical care time: 16mn  AJesusita Oka MD General and [facility]

## 2022-07-07 NOTE — Progress Notes (Signed)
Patient had episode of desaturation from 90's to mid 60's within approximately one minute of stating "I am not feeling well." Respiratory immediately called to room after desaturation and Dr Bobbye Morton at bedside within 5 minutes of calling. Pts daughter at bedside.
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
## 2022-07-07 NOTE — Progress Notes (Signed)
Patient to be extubated and put on comfort measured per Dr. Bobbye Morton after extensive conference with patients 4 children. Family at bedside.

## 2022-07-07 NOTE — Progress Notes (Signed)
I responded to a page from the nurse to provide spiritual support for the patient's family. I arrived at the patient's room where many family members were present. I provided spiritual care through pastoral presence, by reading scripture, and by leading in prayer.    2022/07/06 1918  Clinical Encounter Type  Visited With Patient and family together  Visit Type Initial;Spiritual support  Referral From Nurse  Consult/Referral To Chaplain  Spiritual Encounters  Spiritual Needs Sacred text;Prayer;Emotional    Chaplain Dr Redgie Grayer

## 2022-07-07 NOTE — Progress Notes (Signed)
   90 ml's of Morphine wasted in Stericycle with Marguerita Beards, RN.  175 mL's Fentanyl wasted in stericycle with Marguerita Beards, RN.   Both were also wasted in the pyxis.

## 2022-07-07 DEATH — deceased
